# Patient Record
Sex: Male | Born: 1945 | Race: Black or African American | Hispanic: No | Marital: Single | Smoking: Never smoker
Health system: Southern US, Community
[De-identification: ages and names within clinical notes are randomized; demographics above are authoritative.]

---

## 2015-06-05 DIAGNOSIS — Z23 Encounter for immunization: Secondary | ICD-10-CM | POA: Diagnosis not present

## 2018-05-03 DIAGNOSIS — Z23 Encounter for immunization: Secondary | ICD-10-CM | POA: Diagnosis not present

## 2019-02-07 ENCOUNTER — Telehealth: Payer: Self-pay

## 2019-02-07 NOTE — Telephone Encounter (Signed)
Unable to reach pt as requested by insurance company for gap list, never seen pt before and we need to be removed off his insurance provider list,. 02/07/19

## 2020-12-20 ENCOUNTER — Emergency Department: Payer: Medicare Other

## 2020-12-20 ENCOUNTER — Inpatient Hospital Stay
Admission: EM | Admit: 2020-12-20 | Discharge: 2020-12-22 | DRG: 064 | Disposition: A | Discharge status: Home / Self Care | Payer: Medicare Other | Attending: Internal Medicine | Admitting: Internal Medicine

## 2020-12-20 ENCOUNTER — Inpatient Hospital Stay: Payer: Medicare Other

## 2020-12-20 ENCOUNTER — Inpatient Hospital Stay: Admit: 2020-12-20 | Payer: Medicare Other

## 2020-12-20 ENCOUNTER — Other Ambulatory Visit: Payer: Self-pay

## 2020-12-20 DIAGNOSIS — I358 Other nonrheumatic aortic valve disorders: Secondary | ICD-10-CM | POA: Diagnosis present

## 2020-12-20 DIAGNOSIS — U071 COVID-19: Secondary | ICD-10-CM | POA: Diagnosis present

## 2020-12-20 DIAGNOSIS — Z8249 Family history of ischemic heart disease and other diseases of the circulatory system: Secondary | ICD-10-CM

## 2020-12-20 DIAGNOSIS — E876 Hypokalemia: Secondary | ICD-10-CM | POA: Diagnosis present

## 2020-12-20 DIAGNOSIS — S8001XA Contusion of right knee, initial encounter: Secondary | ICD-10-CM | POA: Diagnosis present

## 2020-12-20 DIAGNOSIS — J9601 Acute respiratory failure with hypoxia: Secondary | ICD-10-CM | POA: Diagnosis present

## 2020-12-20 DIAGNOSIS — I700 Atherosclerosis of aorta: Secondary | ICD-10-CM | POA: Diagnosis present

## 2020-12-20 DIAGNOSIS — W1811XA Fall from or off toilet without subsequent striking against object, initial encounter: Secondary | ICD-10-CM | POA: Diagnosis present

## 2020-12-20 DIAGNOSIS — K760 Fatty (change of) liver, not elsewhere classified: Secondary | ICD-10-CM | POA: Diagnosis present

## 2020-12-20 DIAGNOSIS — I6389 Other cerebral infarction: Secondary | ICD-10-CM | POA: Diagnosis not present

## 2020-12-20 DIAGNOSIS — Z28310 Unvaccinated for covid-19: Secondary | ICD-10-CM | POA: Diagnosis not present

## 2020-12-20 DIAGNOSIS — I639 Cerebral infarction, unspecified: Secondary | ICD-10-CM | POA: Diagnosis present

## 2020-12-20 DIAGNOSIS — I6381 Other cerebral infarction due to occlusion or stenosis of small artery: Secondary | ICD-10-CM | POA: Diagnosis present

## 2020-12-20 DIAGNOSIS — R778 Other specified abnormalities of plasma proteins: Secondary | ICD-10-CM | POA: Diagnosis present

## 2020-12-20 DIAGNOSIS — N179 Acute kidney failure, unspecified: Secondary | ICD-10-CM | POA: Diagnosis present

## 2020-12-20 DIAGNOSIS — I214 Non-ST elevation (NSTEMI) myocardial infarction: Secondary | ICD-10-CM

## 2020-12-20 DIAGNOSIS — R7989 Other specified abnormal findings of blood chemistry: Secondary | ICD-10-CM | POA: Diagnosis present

## 2020-12-20 DIAGNOSIS — J1282 Pneumonia due to coronavirus disease 2019: Secondary | ICD-10-CM | POA: Diagnosis present

## 2020-12-20 DIAGNOSIS — R4781 Slurred speech: Secondary | ICD-10-CM | POA: Diagnosis present

## 2020-12-20 DIAGNOSIS — M6282 Rhabdomyolysis: Secondary | ICD-10-CM | POA: Diagnosis present

## 2020-12-20 DIAGNOSIS — I10 Essential (primary) hypertension: Secondary | ICD-10-CM | POA: Diagnosis present

## 2020-12-20 DIAGNOSIS — I6329 Cerebral infarction due to unspecified occlusion or stenosis of other precerebral arteries: Secondary | ICD-10-CM | POA: Diagnosis present

## 2020-12-20 DIAGNOSIS — E860 Dehydration: Secondary | ICD-10-CM | POA: Diagnosis present

## 2020-12-20 DIAGNOSIS — R599 Enlarged lymph nodes, unspecified: Secondary | ICD-10-CM | POA: Diagnosis present

## 2020-12-20 DIAGNOSIS — W19XXXA Unspecified fall, initial encounter: Secondary | ICD-10-CM | POA: Diagnosis present

## 2020-12-20 LAB — URINALYSIS, COMPLETE (UACMP) WITH MICROSCOPIC
Bilirubin Urine: NEGATIVE
Glucose, UA: NEGATIVE mg/dL
Hgb urine dipstick: NEGATIVE
Ketones, ur: NEGATIVE mg/dL
Leukocytes,Ua: NEGATIVE
Nitrite: NEGATIVE
Protein, ur: 100 mg/dL — AB
Specific Gravity, Urine: 1.025 (ref 1.005–1.030)
pH: 5 (ref 5.0–8.0)

## 2020-12-20 LAB — COMPREHENSIVE METABOLIC PANEL
ALT: 35 U/L (ref 0–44)
AST: 51 U/L — ABNORMAL HIGH (ref 15–41)
Albumin: 4.5 g/dL (ref 3.5–5.0)
Alkaline Phosphatase: 48 U/L (ref 38–126)
Anion gap: 12 (ref 5–15)
BUN: 25 mg/dL — ABNORMAL HIGH (ref 8–23)
CO2: 23 mmol/L (ref 22–32)
Calcium: 9 mg/dL (ref 8.9–10.3)
Chloride: 101 mmol/L (ref 98–111)
Creatinine, Ser: 1.81 mg/dL — ABNORMAL HIGH (ref 0.61–1.24)
GFR, Estimated: 39 mL/min — ABNORMAL LOW (ref >60)
Glucose, Bld: 111 mg/dL — ABNORMAL HIGH (ref 70–99)
Potassium: 3.2 mmol/L — ABNORMAL LOW (ref 3.5–5.1)
Sodium: 136 mmol/L (ref 135–145)
Total Bilirubin: 0.9 mg/dL (ref 0.3–1.2)
Total Protein: 7.8 g/dL (ref 6.5–8.1)

## 2020-12-20 LAB — CBC
HCT: 47.9 % (ref 39.0–52.0)
Hemoglobin: 16.7 g/dL (ref 13.0–17.0)
MCH: 25.9 pg — ABNORMAL LOW (ref 26.0–34.0)
MCHC: 34.9 g/dL (ref 30.0–36.0)
MCV: 74.4 fL — ABNORMAL LOW (ref 80.0–100.0)
Platelets: 185 10*3/uL (ref 150–400)
RBC: 6.44 MIL/uL — ABNORMAL HIGH (ref 4.22–5.81)
RDW: 14.1 % (ref 11.5–15.5)
WBC: 7.5 10*3/uL (ref 4.0–10.5)
nRBC: 0 % (ref 0.0–0.2)

## 2020-12-20 LAB — MAGNESIUM: Magnesium: 2.3 mg/dL (ref 1.7–2.4)

## 2020-12-20 LAB — C-REACTIVE PROTEIN: CRP: 14.3 mg/dL — ABNORMAL HIGH (ref <1.0)

## 2020-12-20 LAB — TROPONIN I (HIGH SENSITIVITY)
Troponin I (High Sensitivity): 56 ng/L — ABNORMAL HIGH (ref <18)
Troponin I (High Sensitivity): 59 ng/L — ABNORMAL HIGH (ref <18)
Troponin I (High Sensitivity): 63 ng/L — ABNORMAL HIGH (ref <18)
Troponin I (High Sensitivity): 39 ng/L — ABNORMAL HIGH (ref <18)

## 2020-12-20 LAB — PROCALCITONIN: Procalcitonin: 0.29 ng/mL

## 2020-12-20 LAB — PROTIME-INR
INR: 1.1 (ref 0.8–1.2)
Prothrombin Time: 14.6 seconds (ref 11.4–15.2)

## 2020-12-20 LAB — FERRITIN: Ferritin: 220 ng/mL (ref 24–336)

## 2020-12-20 LAB — RESP PANEL BY RT-PCR (FLU A&B, COVID) ARPGX2
Influenza A by PCR: NEGATIVE
Influenza B by PCR: NEGATIVE
SARS Coronavirus 2 by RT PCR: POSITIVE — AB

## 2020-12-20 LAB — TSH: TSH: 1.449 u[IU]/mL (ref 0.350–4.500)

## 2020-12-20 LAB — BRAIN NATRIURETIC PEPTIDE: B Natriuretic Peptide: 24.9 pg/mL (ref 0.0–100.0)

## 2020-12-20 LAB — CK: Total CK: 819 U/L — ABNORMAL HIGH (ref 49–397)

## 2020-12-20 LAB — D-DIMER, QUANTITATIVE: D-Dimer, Quant: 2.78 ug/mL-FEU — ABNORMAL HIGH (ref 0.00–0.50)

## 2020-12-20 LAB — APTT: aPTT: 32 seconds (ref 24–36)

## 2020-12-20 IMAGING — CR DG CHEST 2V
2 series · 2 of 2 positions shown · non-contrast
Comparison: None

CLINICAL DATA: Cough, fever, leg weakness

EXAM:
CHEST - 2 VIEW

[chest pa]
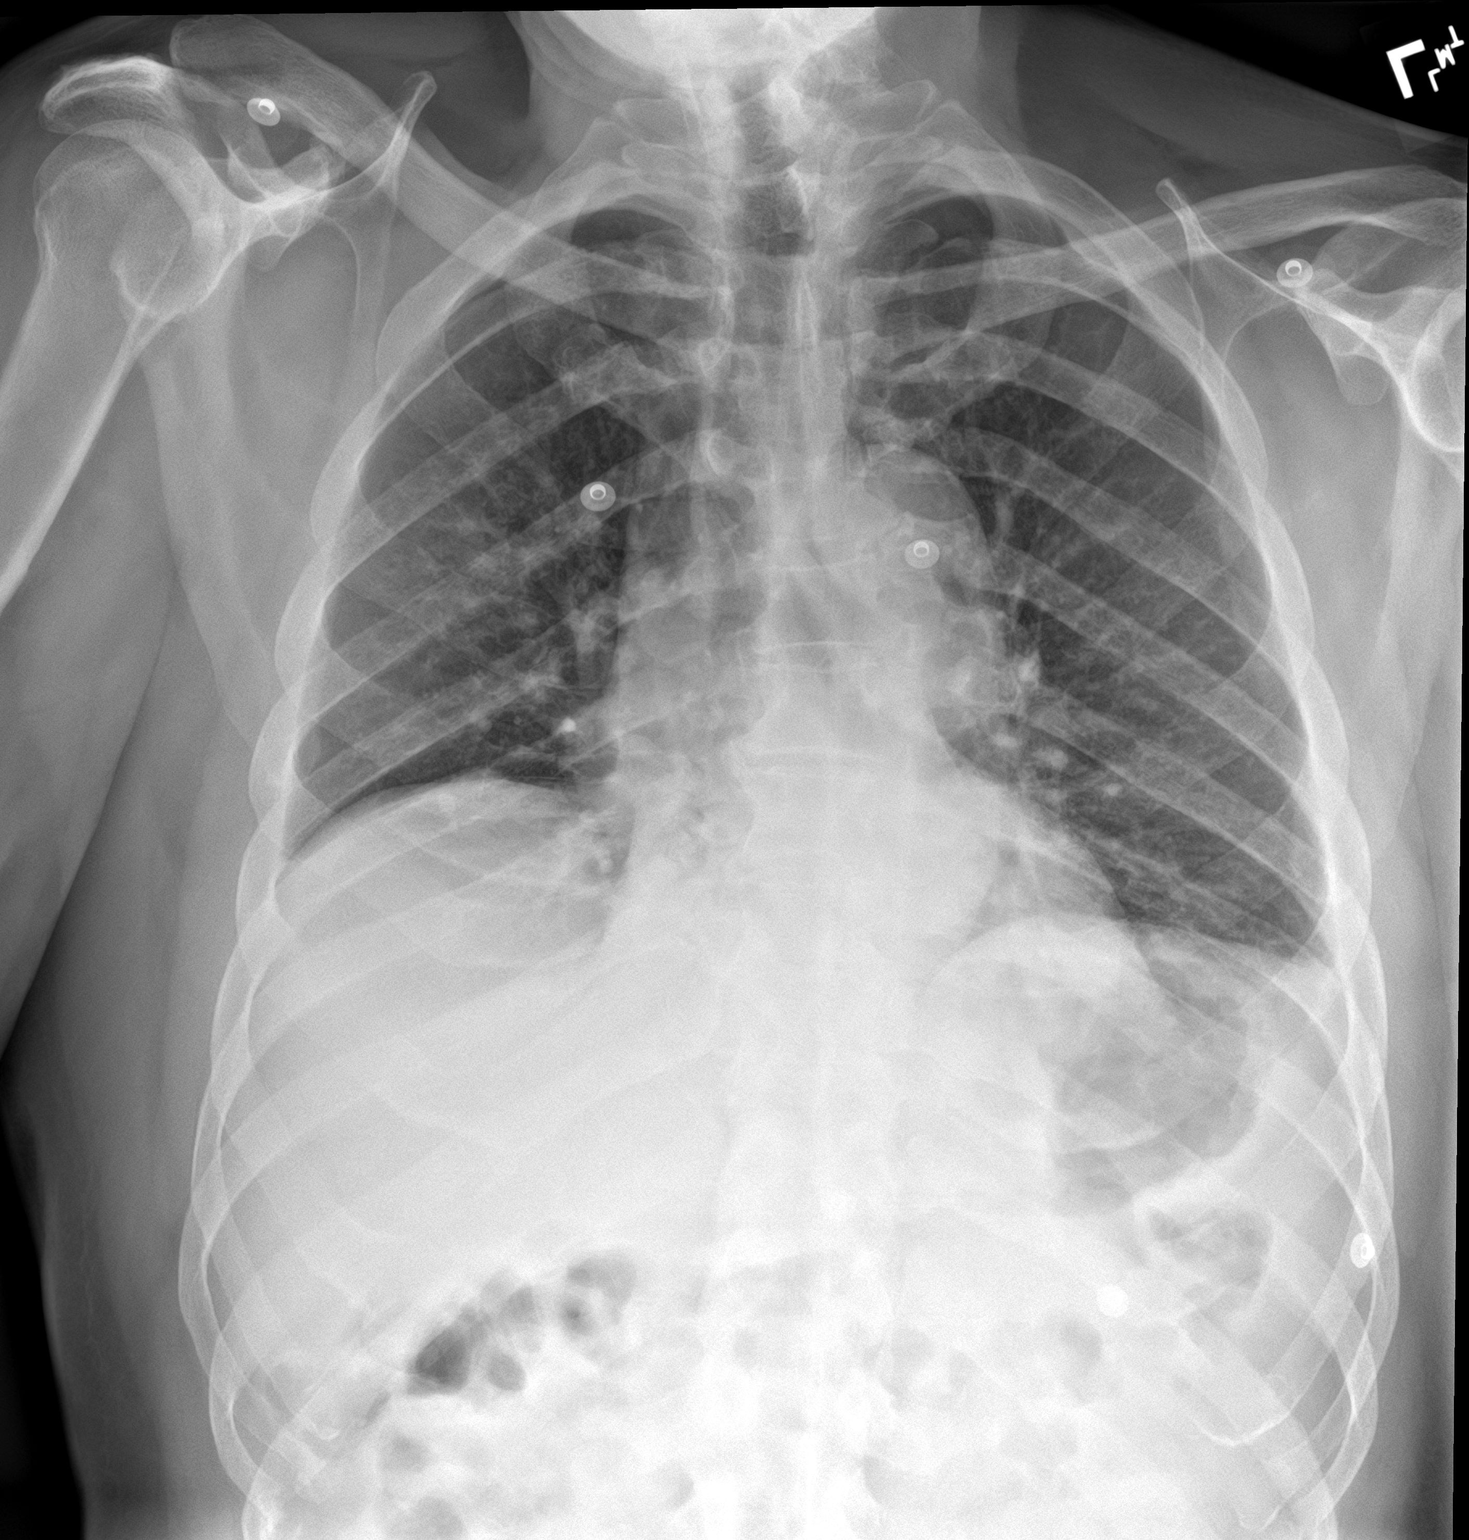

[chest lat]
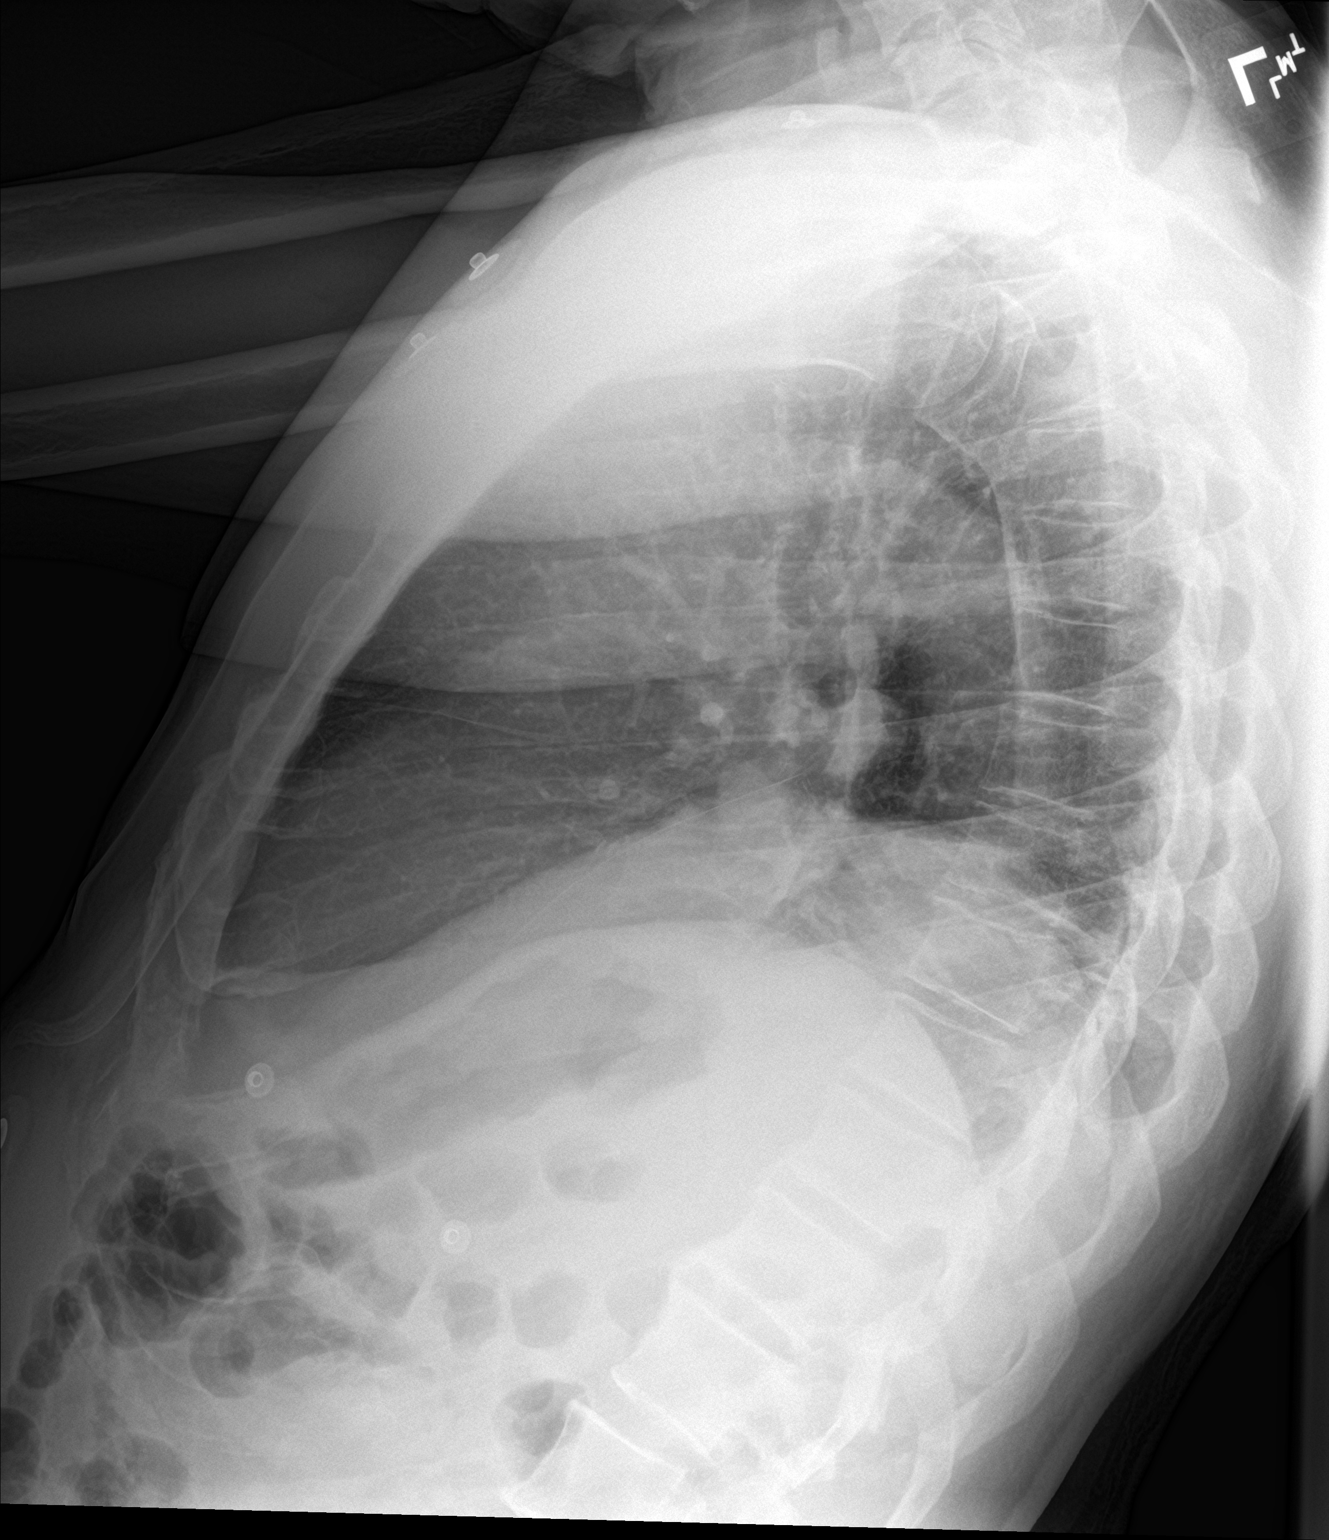

[2 of 2 positions shown; findings below may reference images not displayed]

FINDINGS: Normal heart size, mediastinal contours, and pulmonary vascularity.

Elevation versus eventration of RIGHT diaphragm.

Question hazy infiltrate in RIGHT upper lobe.

Mild RIGHT basilar atelectasis.

LEFT lung clear.

No pleural effusion or pneumothorax.
IMPRESSION: RIGHT basilar atelectasis with questionable hazy infiltrate in RIGHT
upper lobe.
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
## 2020-12-20 IMAGING — CR DG KNEE COMPLETE 4+V*R*
1 series · 4 of 4 positions shown · non-contrast
Comparison: None.

CLINICAL DATA: Right knee pain after falling a few days previously

EXAM:
RIGHT KNEE - COMPLETE 4+ VIEW

[Series 1: dg knee complete 4 views right · 0.14mm/px · 4 of 4 slices shown]
[im 1/4]
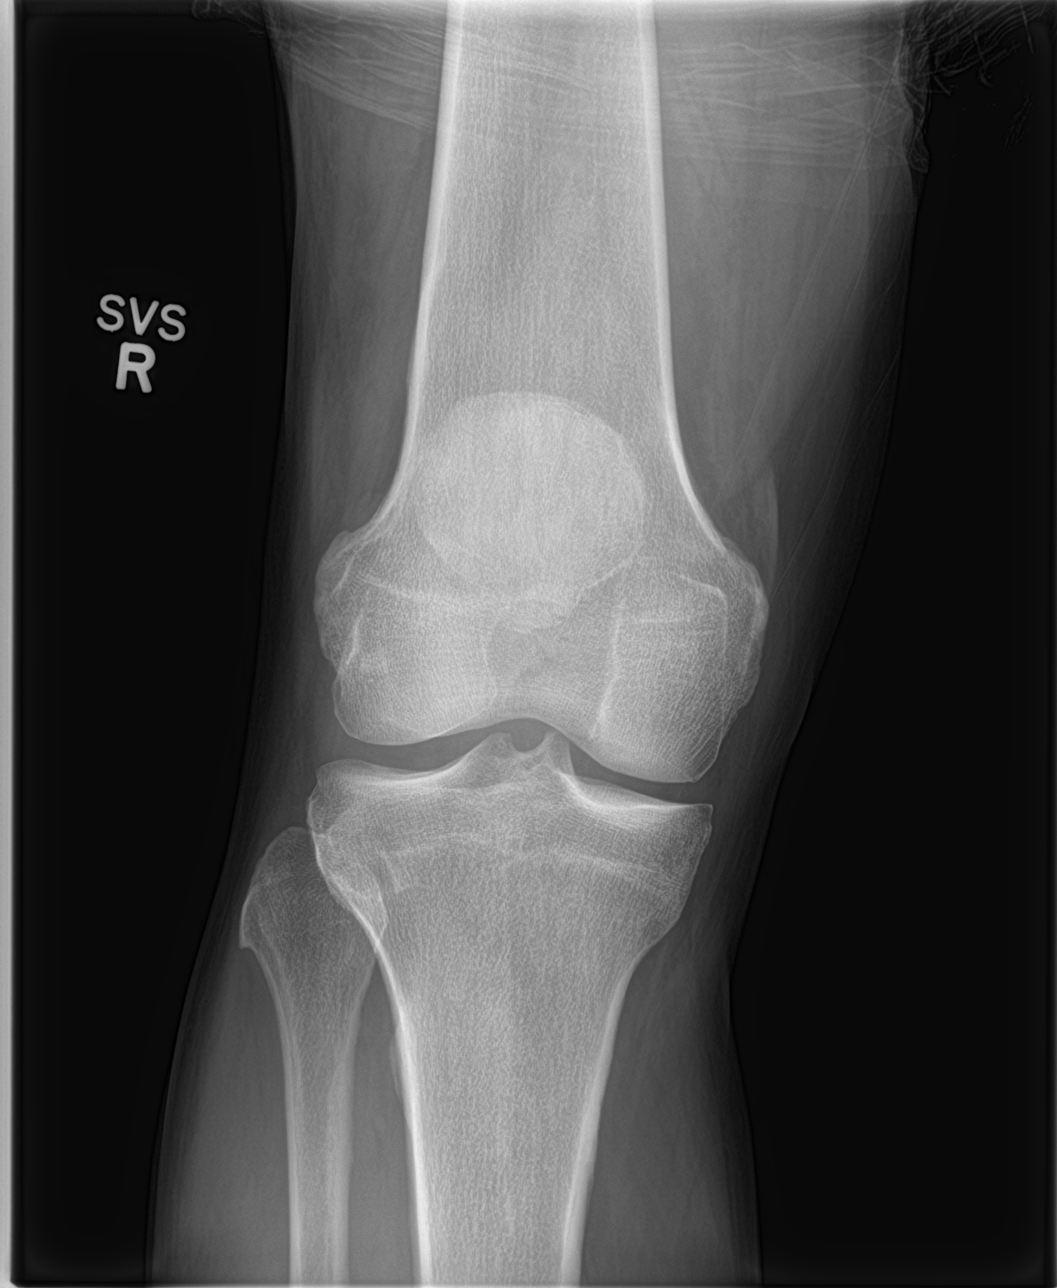
[im 2/4]
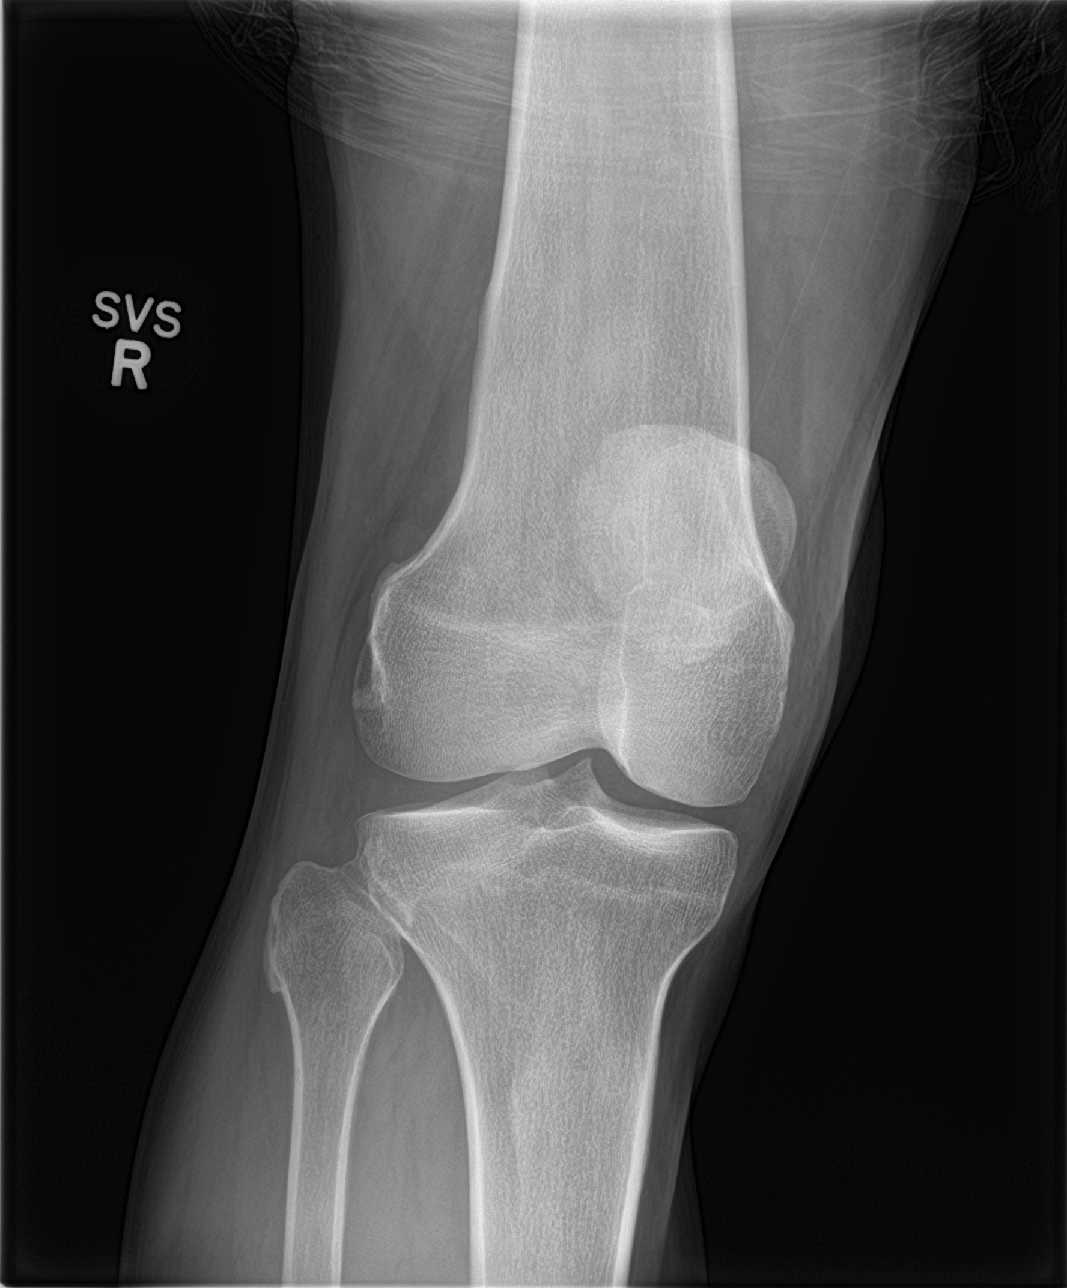
[im 3/4]
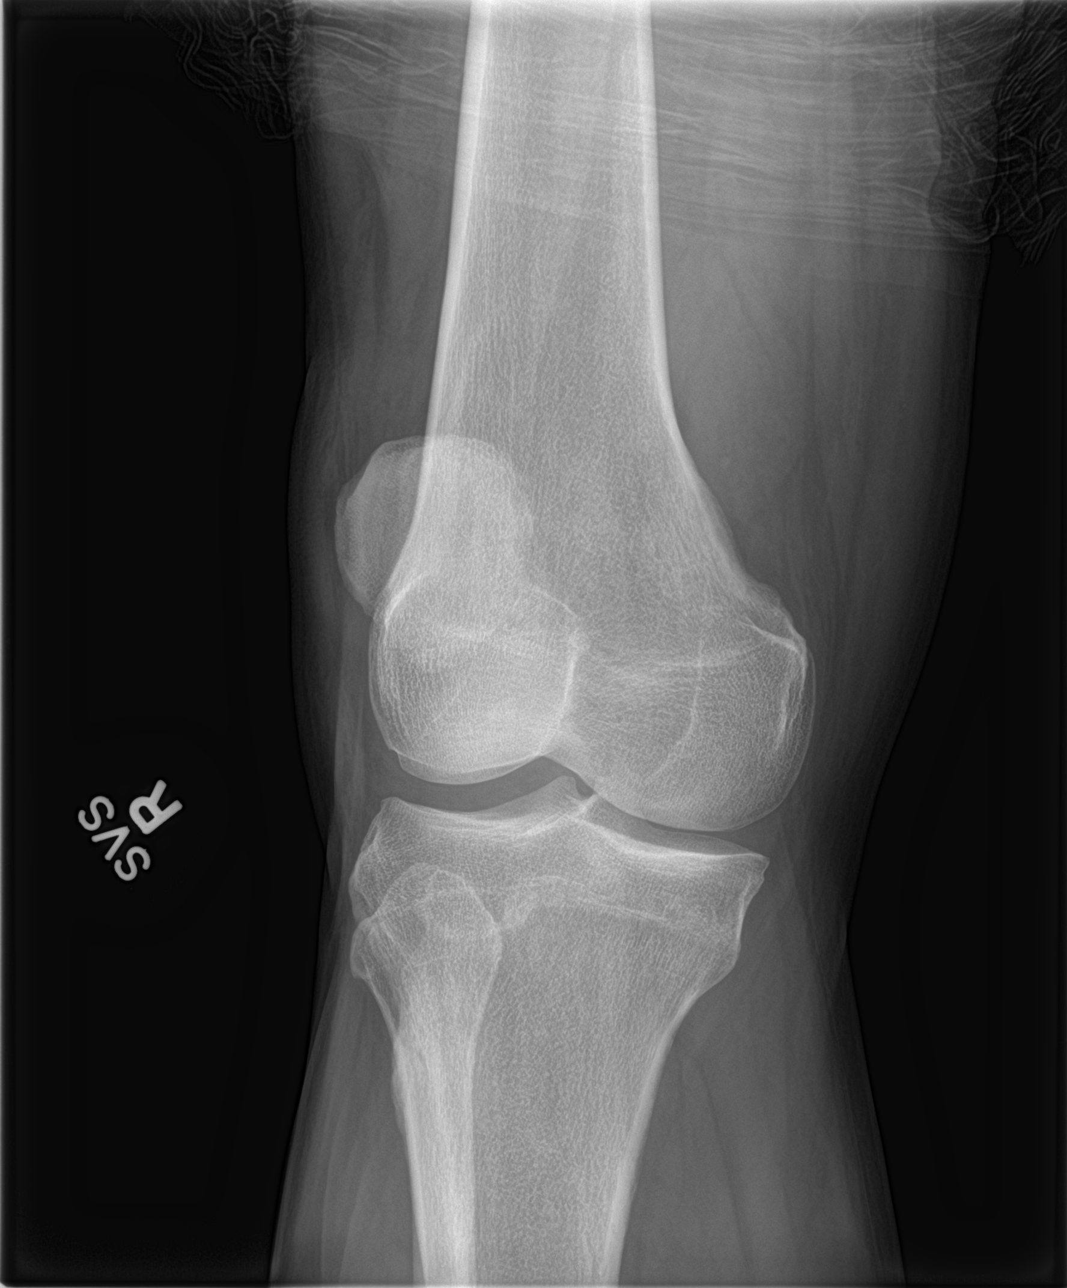
[im 4/4]
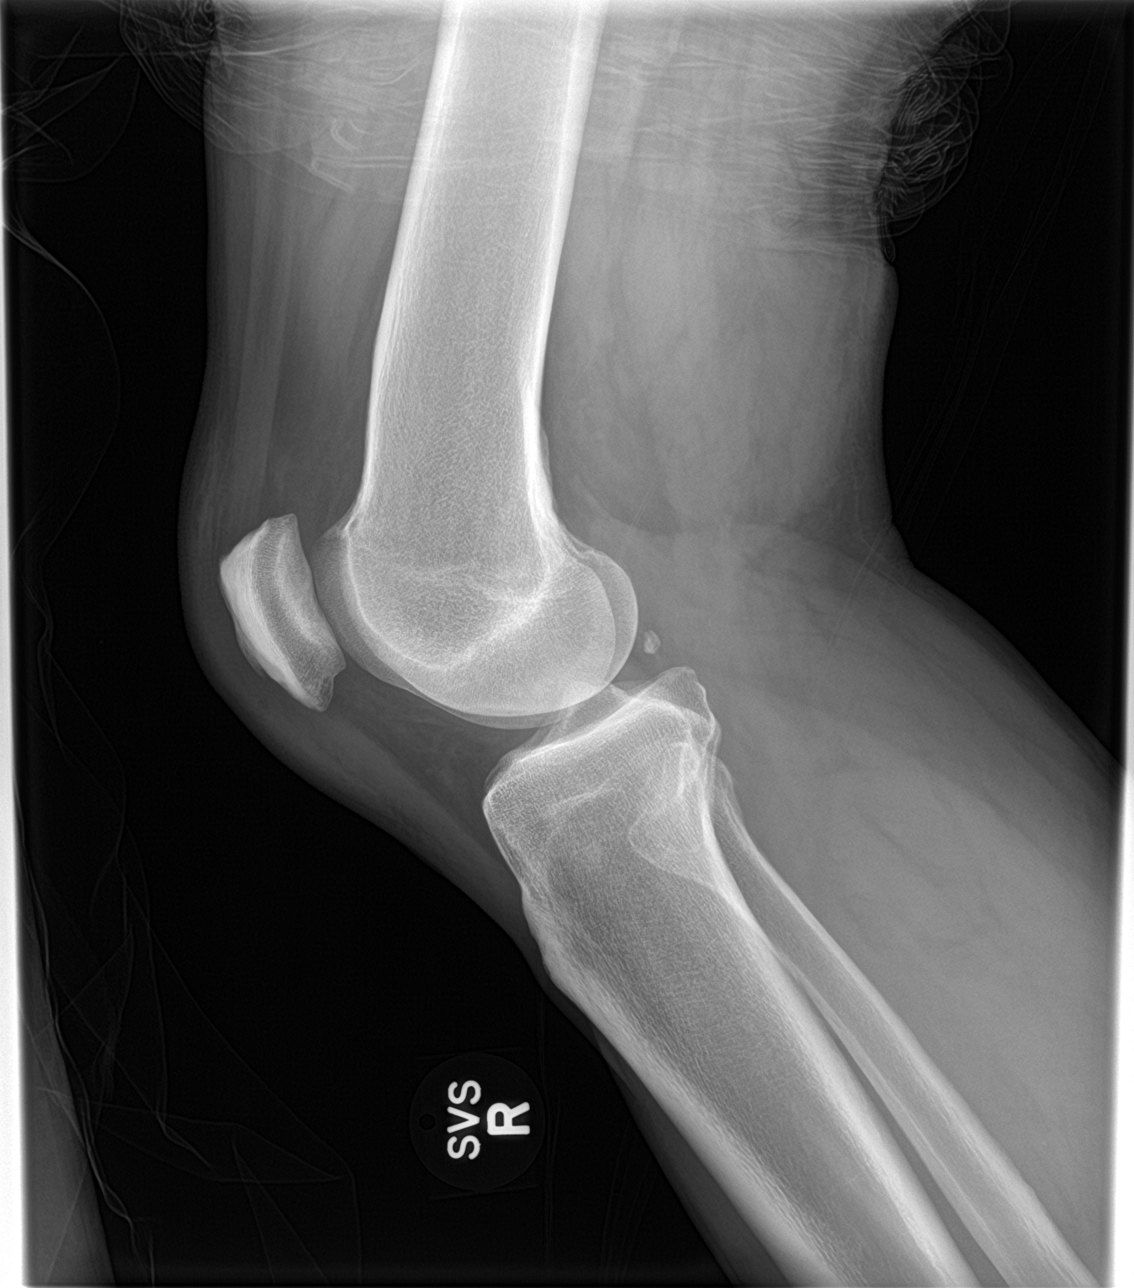

[4 of 4 positions shown; findings below may reference images not displayed]

FINDINGS: No acute fracture, malalignment or joint effusion. No lytic or
blastic osseous lesion. Focal soft tissue swelling is present in the
prepatellar space. The remaining soft tissues are unremarkable.
IMPRESSION: Prepatellar soft tissue swelling may reflect contusion or bursitis.

No evidence of fracture, malalignment or knee joint effusion.

## 2020-12-20 IMAGING — CT CT ANGIO CHEST
2 of 6 series · 17 of 46 positions shown · IV contrast (APPLIED)
Comparison: Chest radiograph [DATE]

CLINICAL DATA: Shortness of breath with elevated D-dimer study

EXAM:
CT ANGIOGRAPHY CHEST WITH CONTRAST
TECHNIQUE: Multidetector CT imaging of the chest was performed using the
standard protocol during bolus administration of intravenous
contrast. Multiplanar CT image reconstructions and MIPs were
obtained to evaluate the vascular anatomy.
CONTRAST:  60mL OMNIPAQUE IOHEXOL 350 MG/ML SOLN

[Series 5: thins · axial · 0.70mm/px · z∈[-612,-367]mm · 14 of 334 slices shown]
[im 14/334  lung]
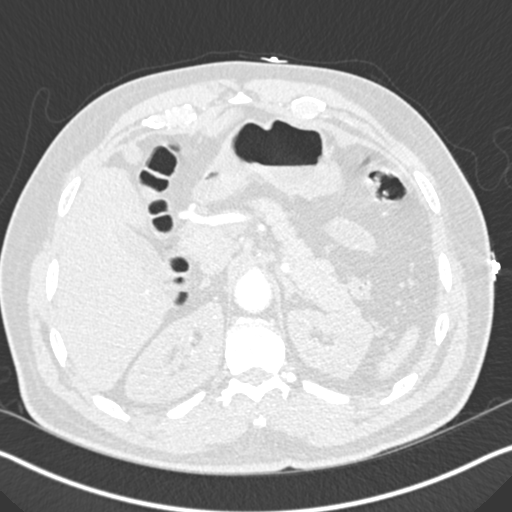
[im 42/334  soft-tissue]
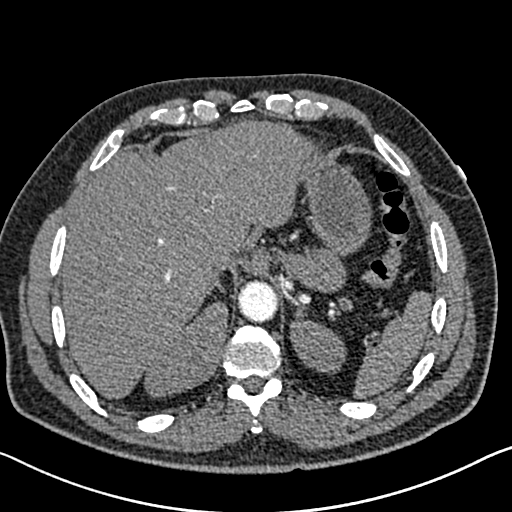
[im 70/334  lung]
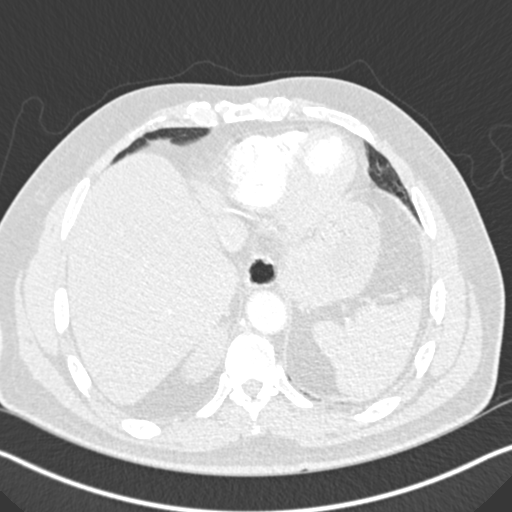
[im 84/334  soft-tissue]
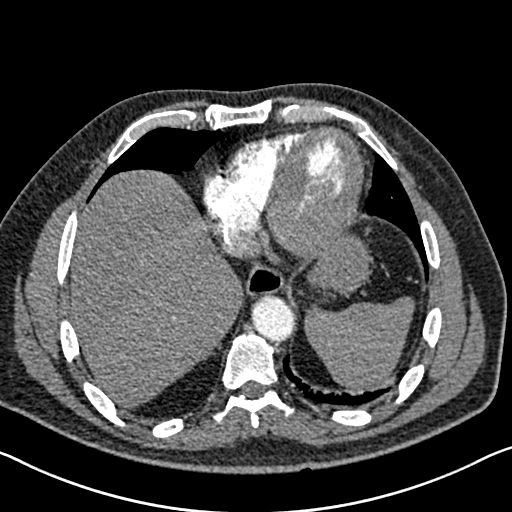
[im 112/334  lung]
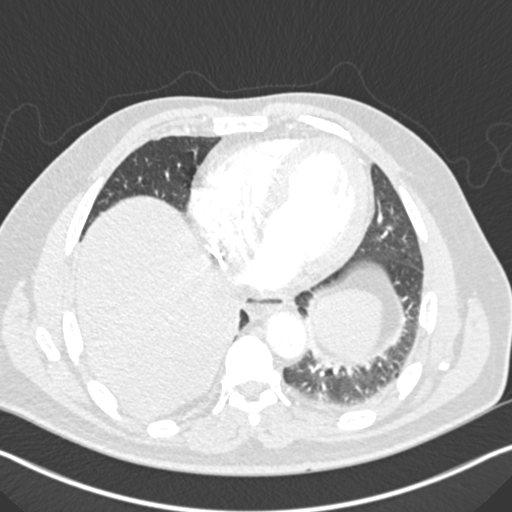
[im 139/334  soft-tissue]
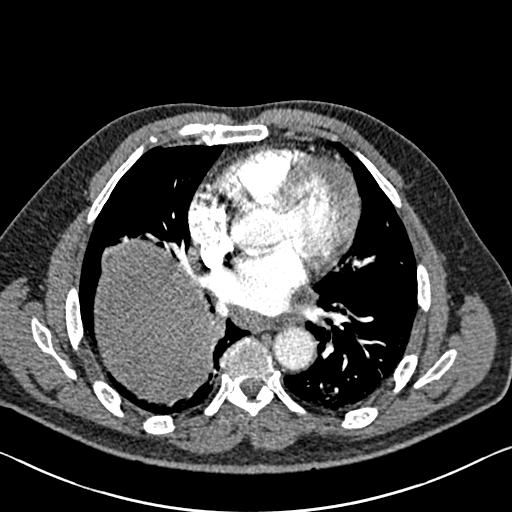
[im 153/334  lung]
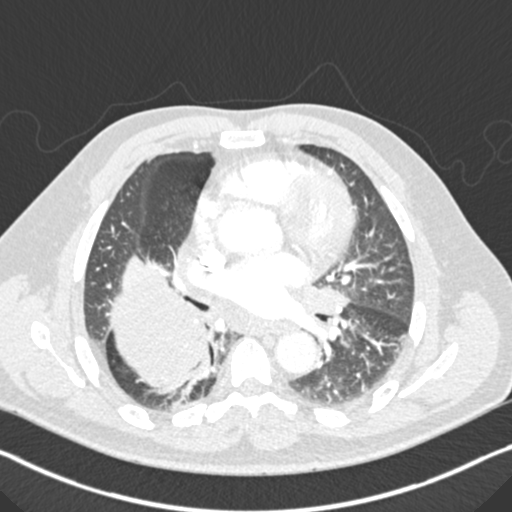
[im 181/334  soft-tissue]
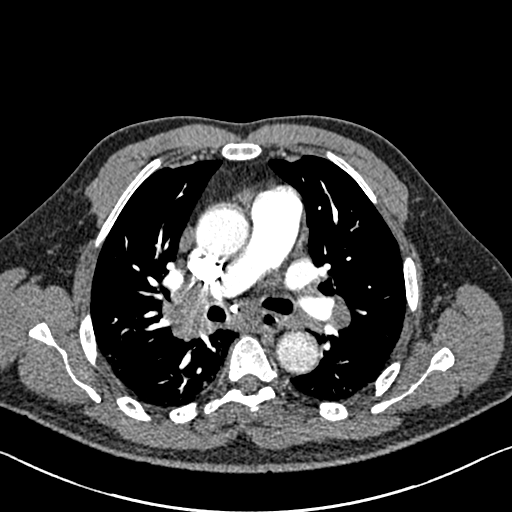
[im 195/334  lung]
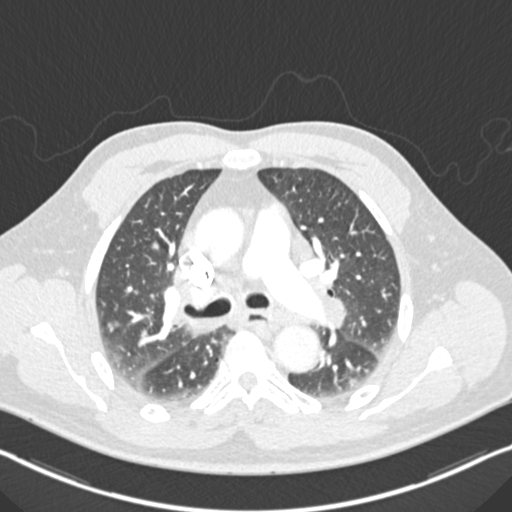
[im 223/334  soft-tissue]
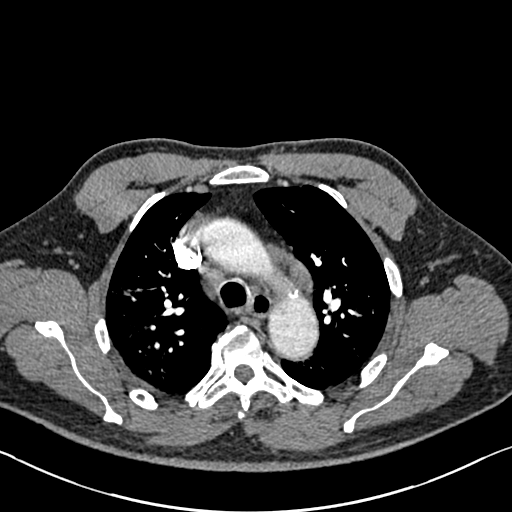
[im 250/334  lung]
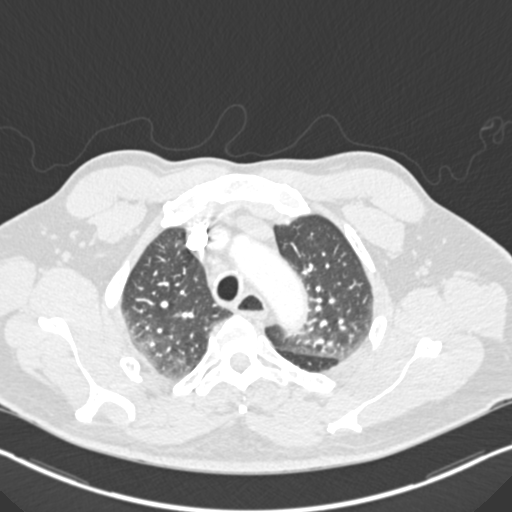
[im 264/334  soft-tissue]
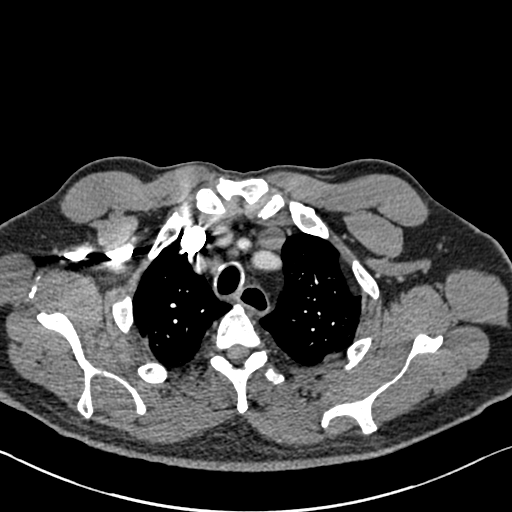
[im 292/334  lung]
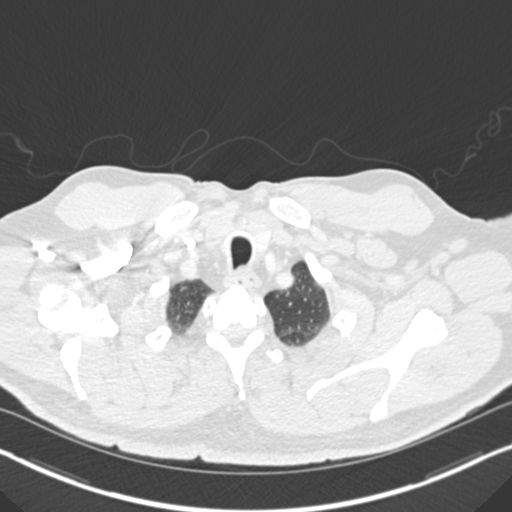
[im 320/334  soft-tissue]
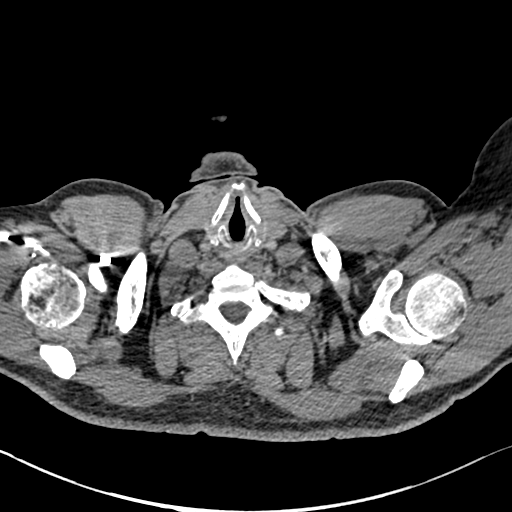

[Series 7: coronal mpr · coronal · 0.54mm/px · 3 of 97 slices shown]
[im 25/97  soft-tissue]
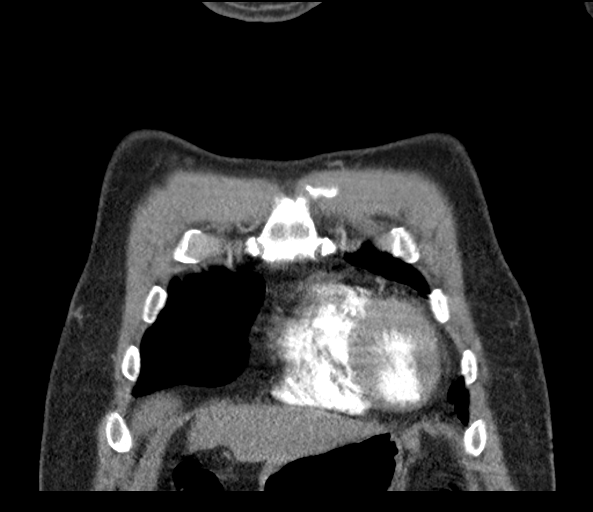
[im 49/97  soft-tissue]
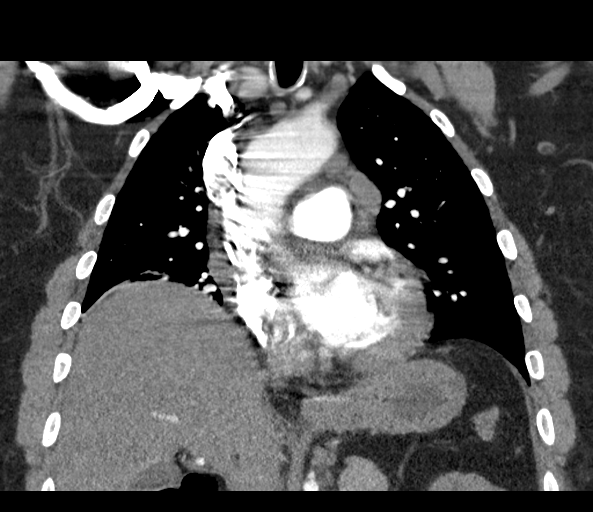
[im 73/97  soft-tissue]
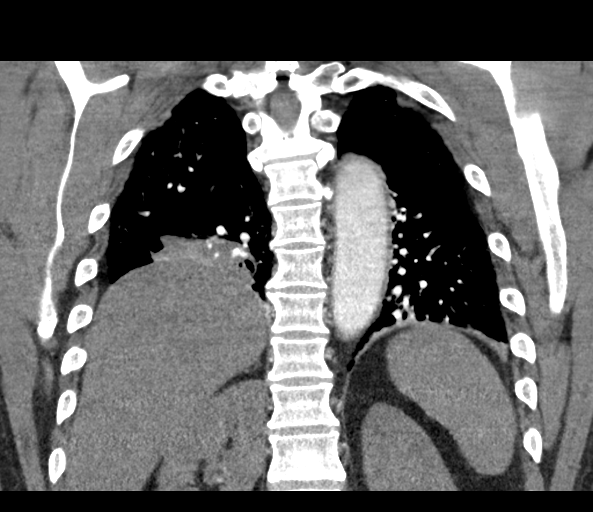

[17 of 46 positions shown; findings below may reference images not displayed]

FINDINGS: Cardiovascular: There is no appreciable pulmonary embolus. There is
no appreciable thoracic aortic aneurysm or dissection. Visualized
great vessels appear unremarkable. There are occasional foci of
aortic atherosclerosis. Occasional foci coronary artery
calcification noted. There is no pericardial effusion or pericardial
thickening.

Mediastinum/Nodes: Thyroid appears normal. There is multifocal
adenopathy. There is an aortopulmonary window lymph node measuring
3.0 x 2.1 cm. An adjacent lymph node in this area measures 1.1 x
cm. There is a lymph node to the left of carina measuring 1.5 x
cm. There is a lymph node anteriorly along the rightward aspect of
the distal trachea measuring 1.8 x 1.7 cm. There are several
subcarinal lymph nodes, largest measuring 1.9 x 1.8 cm. There is
adenopathy in the left hilar region, largest lymph node measuring
1.8 x 1.5 cm in this area. There are multiple right hilar lymph
nodes, largest measuring 1.8 x 1.6 cm. No esophageal lesions are
appreciable.

Lungs/Pleura: There is elevation of the right hemidiaphragm. There
is patchy airspace opacity in the right base. There is atelectatic
change in the left base. Areas of patchy airspace opacity are noted
in the anterior and posterior segments of the right upper lobe with
several tree on HOLINESS type opacities as well as areas of slightly more
focal infiltrate in the right upper lobe. Trachea and major
bronchial structures appear patent. No pneumothorax. No pleural
effusions.

Upper Abdomen: There is hepatic steatosis. Visualized upper
abdominal structures otherwise appear unremarkable.

Musculoskeletal: No blastic or lytic bone lesions. No appreciable
chest wall lesions.

Review of the MIP images confirms the above findings.
IMPRESSION: 1. No evident pulmonary embolus. No thoracic aortic aneurysm or
dissection. Occasional foci of aortic atherosclerosis and coronary
artery calcification.

2. Multifocal adenopathy of uncertain etiology. Neoplastic cause for
adenopathy must be of concern.

3. Areas of airspace opacity in the right upper lobe and to a lesser
degree in the right lower lobe. Atelectatic change in the lung bases
also present. There is elevation of the right hemidiaphragm.

4.  Hepatic steatosis.

Aortic Atherosclerosis ([8D]-[8D]).
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
## 2020-12-20 IMAGING — US US EXTREM LOW VENOUS
1 series · 14 of 24 positions shown · non-contrast
Comparison: None.

CLINICAL DATA: Positive D-dimer

EXAM:
BILATERAL LOWER EXTREMITY VENOUS DOPPLER ULTRASOUND
TECHNIQUE: Gray-scale sonography with compression, as well as color and duplex
ultrasound, were performed to evaluate the deep venous system(s)
from the level of the common femoral vein through the popliteal and
proximal calf veins.

[Series 1: us venous img lower bilat (dvt) · portal-venous · 14 of 60 slices shown]
[im 1/60]
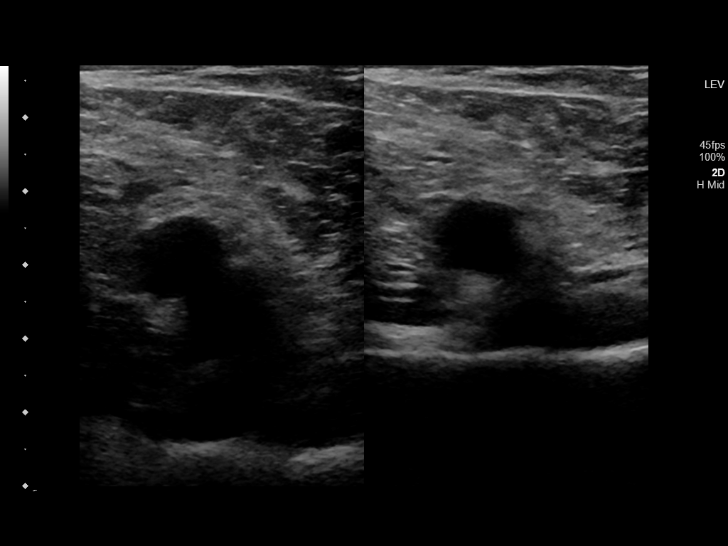
[im 6/60]
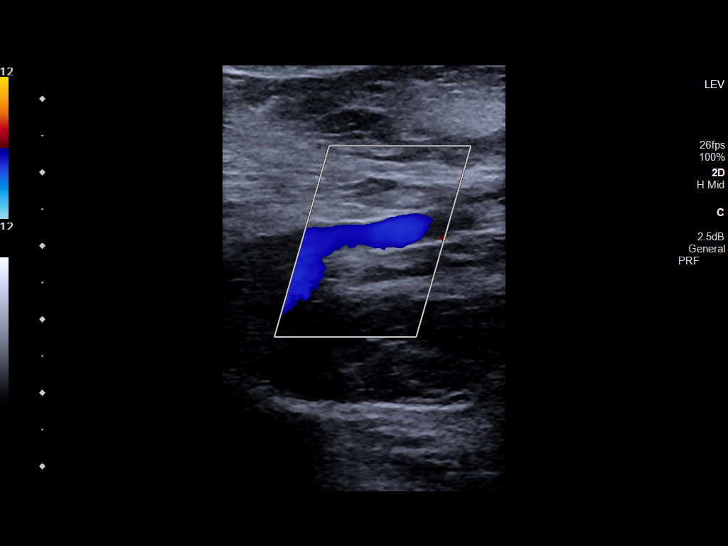
[im 11/60]
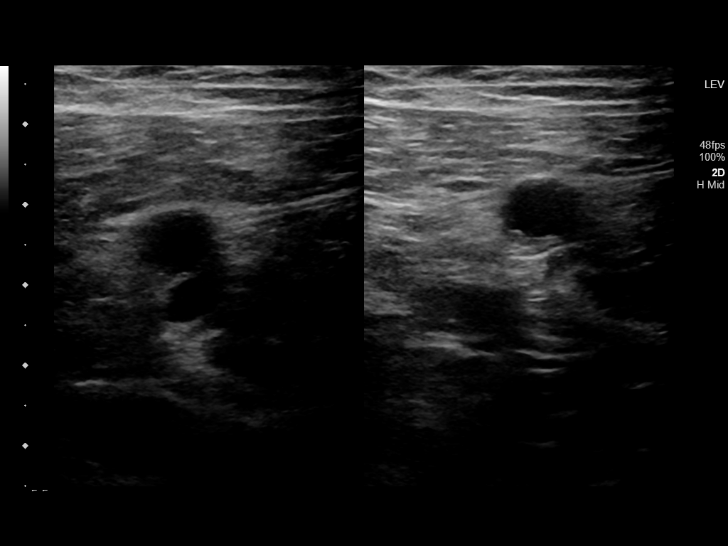
[im 16/60]
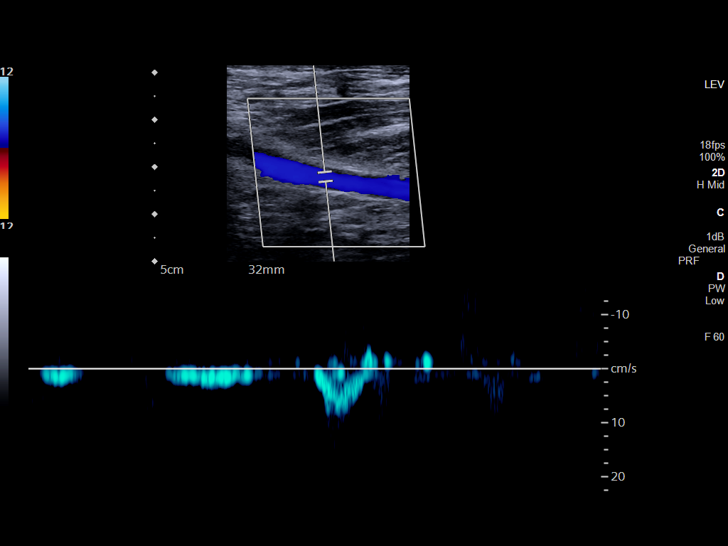
[im 18/60]
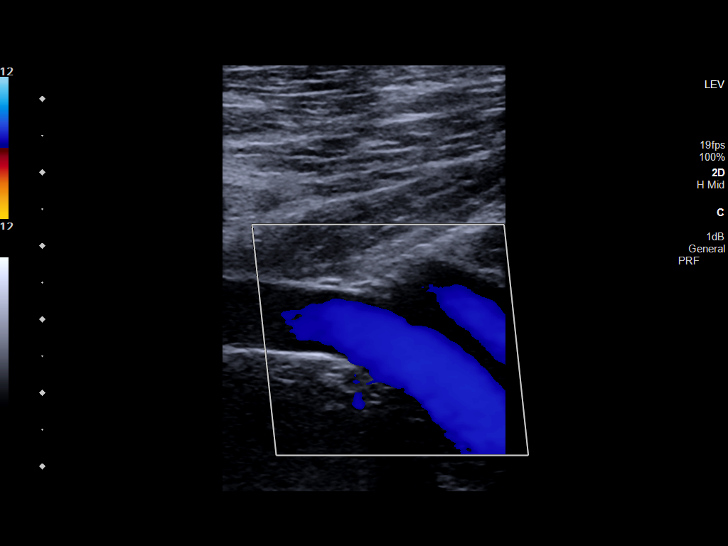
[im 24/60]
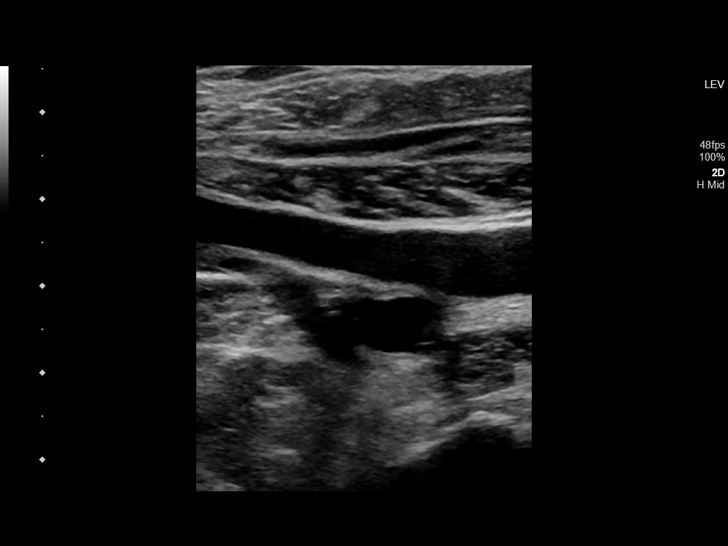
[im 29/60]
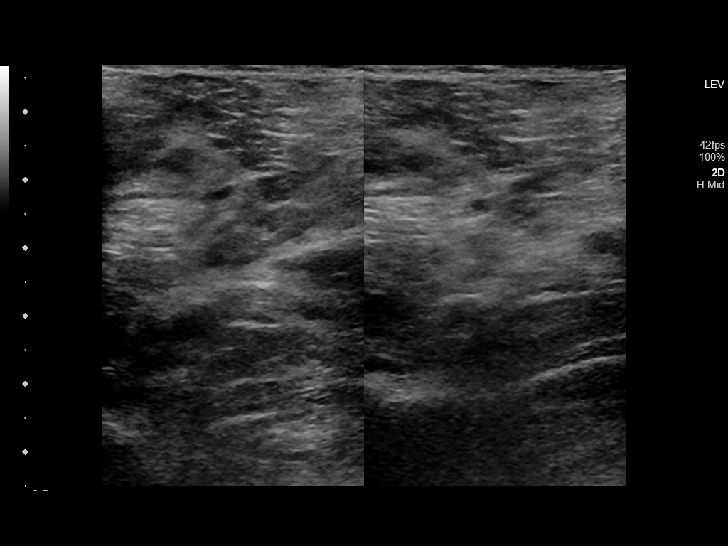
[im 31/60]
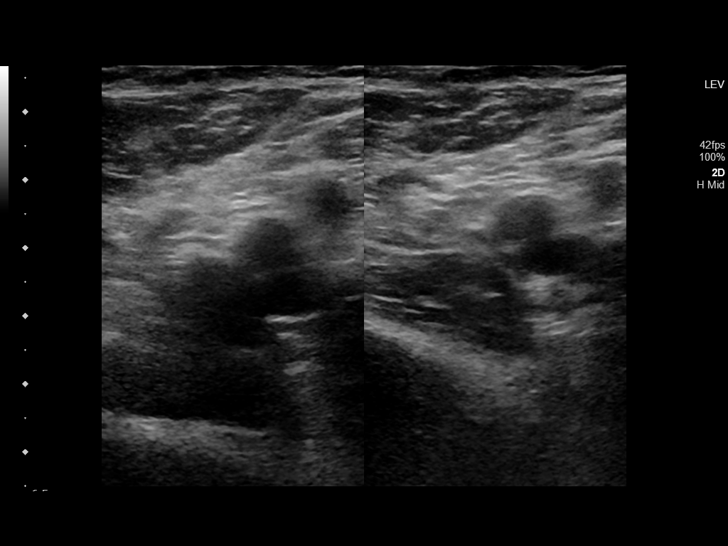
[im 36/60]
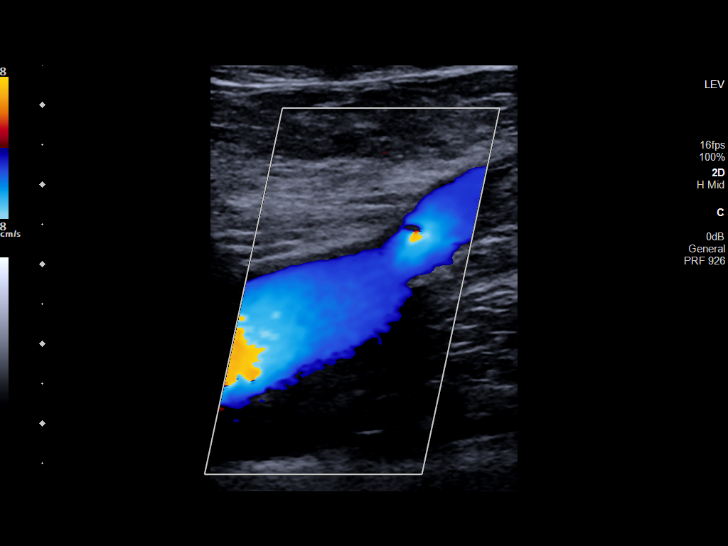
[im 42/60]
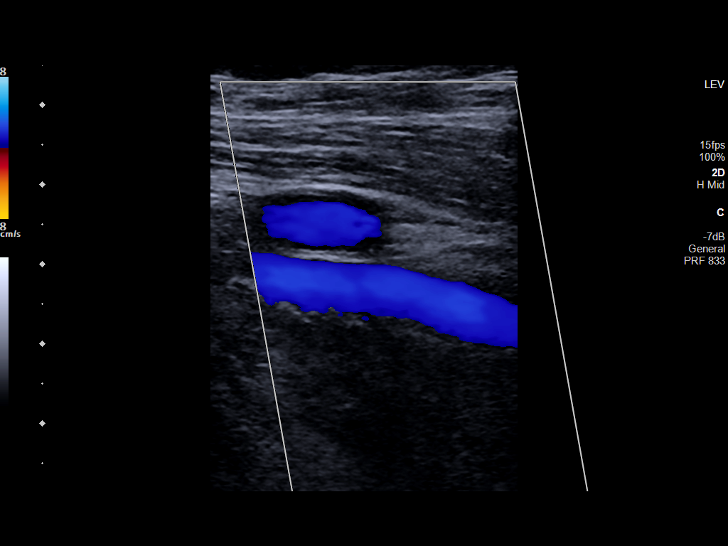
[im 47/60]
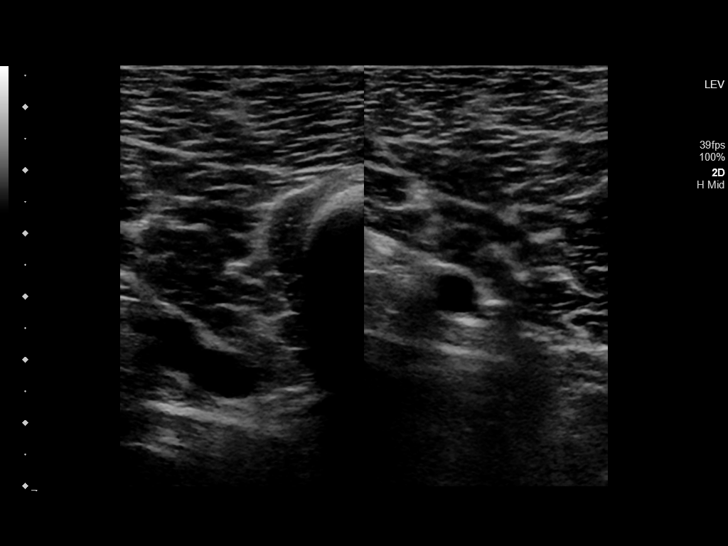
[im 49/60]
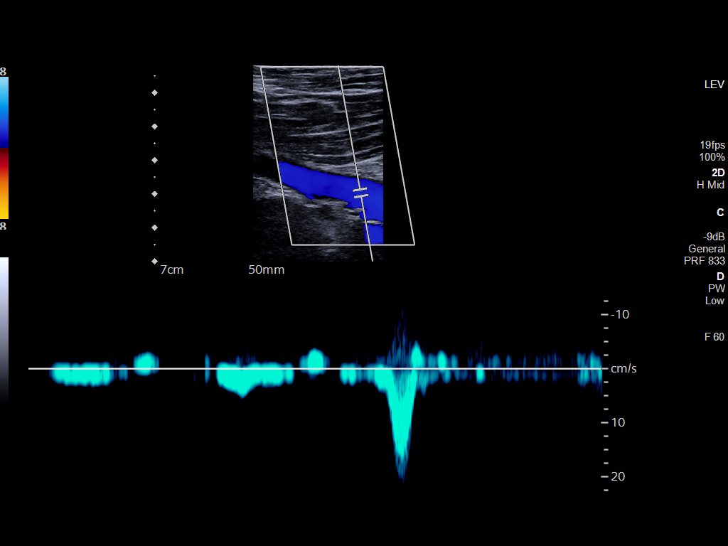
[im 54/60]
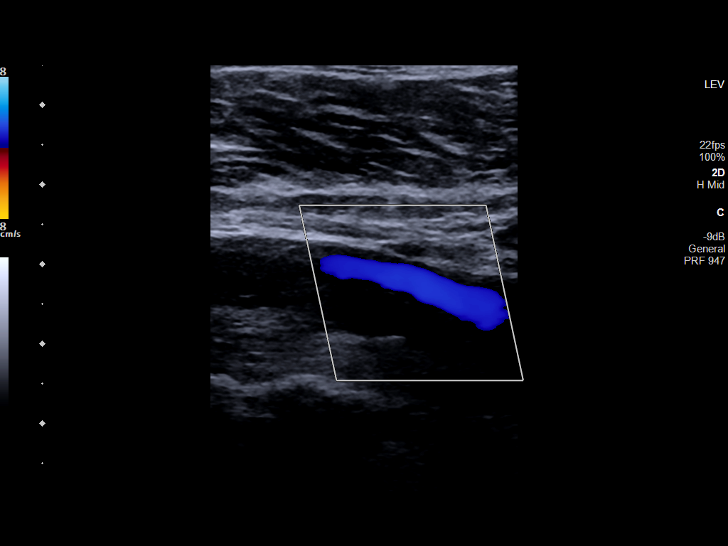
[im 60/60]
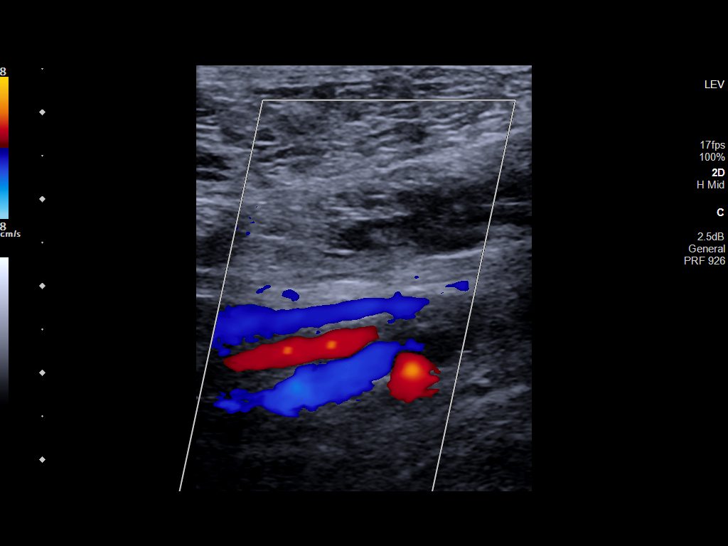

[14 of 24 positions shown; findings below may reference images not displayed]

FINDINGS: VENOUS

Normal compressibility of the common femoral, superficial femoral,
and popliteal veins, as well as the visualized calf veins.
Visualized portions of profunda femoral vein and great saphenous
vein unremarkable. No filling defects to suggest DVT on grayscale or
color Doppler imaging. Doppler waveforms show normal direction of
venous flow, normal respiratory plasticity and response to
augmentation.

OTHER

None.

Limitations: none
IMPRESSION: Negative.
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
## 2020-12-20 MED ORDER — ASPIRIN EC 81 MG PO TBEC
81.0000 mg | DELAYED_RELEASE_TABLET | Freq: Every day | ORAL | Status: DC
  Administered 2020-12-21 – 2020-12-22 (×2): 81 mg via ORAL
  Filled 2020-12-20 (×2): qty 1

## 2020-12-20 MED ORDER — IPRATROPIUM BROMIDE HFA 17 MCG/ACT IN AERS
2.0000 | INHALATION_SPRAY | Freq: Four times a day (QID) | RESPIRATORY_TRACT | Status: DC
  Administered 2020-12-20 – 2020-12-22 (×7): 2 via RESPIRATORY_TRACT
  Filled 2020-12-20: qty 12.9

## 2020-12-20 MED ORDER — IOHEXOL 350 MG/ML SOLN
60.0000 mL | Freq: Once | INTRAVENOUS | Status: AC | PRN
  Administered 2020-12-20: 60 mL via INTRAVENOUS

## 2020-12-20 MED ORDER — POTASSIUM CHLORIDE CRYS ER 20 MEQ PO TBCR
80.0000 meq | EXTENDED_RELEASE_TABLET | Freq: Once | ORAL | Status: DC
  Filled 2020-12-20: qty 4

## 2020-12-20 MED ORDER — ALBUTEROL SULFATE HFA 108 (90 BASE) MCG/ACT IN AERS
2.0000 | INHALATION_SPRAY | RESPIRATORY_TRACT | Status: DC | PRN
  Filled 2020-12-20: qty 6.7

## 2020-12-20 MED ORDER — HEPARIN BOLUS VIA INFUSION
4000.0000 [IU] | Freq: Once | INTRAVENOUS | Status: DC
  Filled 2020-12-20: qty 4000

## 2020-12-20 MED ORDER — ONDANSETRON HCL 4 MG/2ML IJ SOLN: 4.0000 mg | Freq: Three times a day (TID) | INTRAMUSCULAR | Status: DC | PRN

## 2020-12-20 MED ORDER — LACTATED RINGERS IV BOLUS
1000.0000 mL | Freq: Once | INTRAVENOUS | Status: AC
  Administered 2020-12-20: 1000 mL via INTRAVENOUS

## 2020-12-20 MED ORDER — SENNOSIDES-DOCUSATE SODIUM 8.6-50 MG PO TABS: 1.0000 | ORAL_TABLET | Freq: Every evening | ORAL | Status: DC | PRN

## 2020-12-20 MED ORDER — ASPIRIN 81 MG PO CHEW
324.0000 mg | CHEWABLE_TABLET | Freq: Once | ORAL | Status: AC
  Administered 2020-12-20: 324 mg via ORAL
  Filled 2020-12-20: qty 4

## 2020-12-20 MED ORDER — LABETALOL HCL 5 MG/ML IV SOLN
10.0000 mg | Freq: Once | INTRAVENOUS | Status: AC
  Administered 2020-12-20: 10 mg via INTRAVENOUS
  Filled 2020-12-20: qty 4

## 2020-12-20 MED ORDER — ACETAMINOPHEN 325 MG PO TABS: 650.0000 mg | ORAL_TABLET | ORAL | Status: DC | PRN

## 2020-12-20 MED ORDER — STROKE: EARLY STAGES OF RECOVERY BOOK: Freq: Once | Status: AC

## 2020-12-20 MED ORDER — ADULT MULTIVITAMIN W/MINERALS CH
1.0000 | ORAL_TABLET | Freq: Every day | ORAL | Status: DC
  Administered 2020-12-21 – 2020-12-22 (×2): 1 via ORAL
  Filled 2020-12-20 (×2): qty 1

## 2020-12-20 MED ORDER — ATORVASTATIN CALCIUM 20 MG PO TABS
40.0000 mg | ORAL_TABLET | Freq: Every day | ORAL | Status: DC
  Administered 2020-12-20 – 2020-12-21 (×2): 40 mg via ORAL
  Filled 2020-12-20 (×2): qty 2

## 2020-12-20 MED ORDER — HEPARIN (PORCINE) 25000 UT/250ML-% IV SOLN: 1100.0000 [IU]/h | INTRAVENOUS | Status: DC

## 2020-12-20 MED ORDER — ENOXAPARIN SODIUM 40 MG/0.4ML IJ SOSY
40.0000 mg | PREFILLED_SYRINGE | INTRAMUSCULAR | Status: DC
  Administered 2020-12-20 – 2020-12-21 (×2): 40 mg via SUBCUTANEOUS
  Filled 2020-12-20 (×2): qty 0.4

## 2020-12-20 MED ORDER — METHYLPREDNISOLONE SODIUM SUCC 40 MG IJ SOLR
40.0000 mg | Freq: Two times a day (BID) | INTRAMUSCULAR | Status: DC
  Administered 2020-12-20 – 2020-12-22 (×4): 40 mg via INTRAVENOUS
  Filled 2020-12-20 (×4): qty 1

## 2020-12-20 MED ORDER — POTASSIUM CHLORIDE CRYS ER 20 MEQ PO TBCR
40.0000 meq | EXTENDED_RELEASE_TABLET | ORAL | Status: AC
  Administered 2020-12-20 (×2): 40 meq via ORAL
  Filled 2020-12-20: qty 2

## 2020-12-20 MED ORDER — SODIUM CHLORIDE 0.9 % IV SOLN
100.0000 mg | Freq: Every day | INTRAVENOUS | Status: DC
  Administered 2020-12-21 – 2020-12-22 (×2): 100 mg via INTRAVENOUS
  Filled 2020-12-20: qty 100
  Filled 2020-12-20 (×2): qty 20

## 2020-12-20 MED ORDER — ACETAMINOPHEN 650 MG RE SUPP: 650.0000 mg | RECTAL | Status: DC | PRN

## 2020-12-20 MED ORDER — AMLODIPINE BESYLATE 5 MG PO TABS
5.0000 mg | ORAL_TABLET | Freq: Every day | ORAL | Status: DC
  Administered 2020-12-20 – 2020-12-21 (×2): 5 mg via ORAL
  Filled 2020-12-20 (×2): qty 1

## 2020-12-20 MED ORDER — SODIUM CHLORIDE 0.9 % IV SOLN
200.0000 mg | Freq: Once | INTRAVENOUS | Status: AC
  Administered 2020-12-20: 200 mg via INTRAVENOUS
  Filled 2020-12-20: qty 200

## 2020-12-20 MED ORDER — ACETAMINOPHEN 160 MG/5ML PO SOLN
650.0000 mg | ORAL | Status: DC | PRN
  Filled 2020-12-20: qty 20.3

## 2020-12-20 MED ORDER — LACTATED RINGERS IV SOLN: INTRAVENOUS | Status: DC

## 2020-12-20 MED ORDER — DM-GUAIFENESIN ER 30-600 MG PO TB12: 1.0000 | ORAL_TABLET | Freq: Two times a day (BID) | ORAL | Status: DC | PRN

## 2020-12-20 MED ORDER — HYDRALAZINE HCL 20 MG/ML IJ SOLN
5.0000 mg | INTRAMUSCULAR | Status: DC | PRN
  Administered 2020-12-21 – 2020-12-22 (×4): 5 mg via INTRAVENOUS
  Filled 2020-12-20 (×5): qty 1
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
## 2020-12-20 NOTE — ED Notes (Signed)
Pt's oxygen saturation observed to be 88% on room air, placed patient on 2L nasal cannula oxygen and oxygen sat is 94%. Pt denies shortness of breath. Respirations regular and unlabored.

## 2020-12-20 NOTE — ED Notes (Signed)
Pt to MRI

## 2020-12-20 NOTE — ED Notes (Signed)
Notified Dr Katrinka Blazing and pt's family of COVID positive result.

## 2020-12-20 NOTE — Progress Notes (Signed)
Remdesivir - Pharmacy Brief Note   O:  ALT: 35 CXR: RIGHT basilar atelectasis with questionable hazy infiltrate in RIGHT upper lobe SpO2: 94% on 2 L [place]   A/P:  Remdesivir 200 mg IVPB once followed by 100 mg IVPB daily x 4 days.   Laureen Ochs, PharmD 12/20/2020 1:24 PM

## 2020-12-20 NOTE — Plan of Care (Signed)
  Problem: Education: Goal: Knowledge of disease or condition will improve Outcome: Progressing Goal: Knowledge of secondary prevention will improve Outcome: Progressing Goal: Knowledge of patient specific risk factors addressed and post discharge goals established will improve Outcome: Progressing   Problem: Coping: Goal: Will verbalize positive feelings about self Outcome: Progressing Goal: Will identify appropriate support needs Outcome: Progressing   Problem: Nutrition: Goal: Risk of aspiration will decrease Outcome: Progressing   Problem: Ischemic Stroke/TIA Tissue Perfusion: Goal: Complications of ischemic stroke/TIA will be minimized Outcome: Progressing   Problem: Education: Goal: Knowledge of General Education information will improve Description: Including pain rating scale, medication(s)/side effects and non-pharmacologic comfort measures Outcome: Progressing   Problem: Clinical Measurements: Goal: Ability to maintain clinical measurements within normal limits will improve Outcome: Progressing Goal: Will remain free from infection Outcome: Progressing Goal: Diagnostic test results will improve Outcome: Progressing Goal: Respiratory complications will improve Outcome: Progressing Goal: Cardiovascular complication will be avoided Outcome: Progressing

## 2020-12-20 NOTE — ED Notes (Signed)
Pt resting comfortably in bed, talking on the phone. Stretcher locked in low position with side rails up x2, call light in reach.

## 2020-12-20 NOTE — ED Notes (Signed)
Pt given dinner tray.
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
## 2020-12-21 ENCOUNTER — Inpatient Hospital Stay (HOSPITAL_COMMUNITY)
Admit: 2020-12-21 | Discharge: 2020-12-21 | Disposition: A | Discharge status: Home / Self Care | Payer: Medicare Other | Attending: Internal Medicine | Admitting: Internal Medicine

## 2020-12-21 DIAGNOSIS — I639 Cerebral infarction, unspecified: Secondary | ICD-10-CM

## 2020-12-21 DIAGNOSIS — I6389 Other cerebral infarction: Secondary | ICD-10-CM | POA: Diagnosis not present

## 2020-12-21 LAB — COMPREHENSIVE METABOLIC PANEL
ALT: 32 U/L (ref 0–44)
AST: 45 U/L — ABNORMAL HIGH (ref 15–41)
Albumin: 3.7 g/dL (ref 3.5–5.0)
Alkaline Phosphatase: 41 U/L (ref 38–126)
Anion gap: 9 (ref 5–15)
BUN: 23 mg/dL (ref 8–23)
CO2: 27 mmol/L (ref 22–32)
Calcium: 8.3 mg/dL — ABNORMAL LOW (ref 8.9–10.3)
Chloride: 99 mmol/L (ref 98–111)
Creatinine, Ser: 1.41 mg/dL — ABNORMAL HIGH (ref 0.61–1.24)
GFR, Estimated: 52 mL/min — ABNORMAL LOW (ref >60)
Glucose, Bld: 168 mg/dL — ABNORMAL HIGH (ref 70–99)
Potassium: 4.6 mmol/L (ref 3.5–5.1)
Sodium: 135 mmol/L (ref 135–145)
Total Bilirubin: 0.8 mg/dL (ref 0.3–1.2)
Total Protein: 7.4 g/dL (ref 6.5–8.1)

## 2020-12-21 LAB — ECHOCARDIOGRAM COMPLETE
AR max vel: 2.23 cm2
AV Area VTI: 2.58 cm2
AV Area mean vel: 2.04 cm2
AV Mean grad: 5 mmHg
AV Peak grad: 8.3 mmHg
Ao pk vel: 1.44 m/s
Area-P 1/2: 7.44 cm2
Height: 68 in
MV VTI: 3.46 cm2
S' Lateral: 2.5 cm
Single Plane A4C EF: 32.8 %
Weight: 3104 oz

## 2020-12-21 LAB — CBC WITH DIFFERENTIAL/PLATELET
Abs Immature Granulocytes: 0.02 10*3/uL (ref 0.00–0.07)
Basophils Absolute: 0 10*3/uL (ref 0.0–0.1)
Basophils Relative: 0 %
Eosinophils Absolute: 0 10*3/uL (ref 0.0–0.5)
Eosinophils Relative: 0 %
HCT: 46.2 % (ref 39.0–52.0)
Hemoglobin: 16.4 g/dL (ref 13.0–17.0)
Immature Granulocytes: 1 %
Lymphocytes Relative: 14 %
Lymphs Abs: 0.6 10*3/uL — ABNORMAL LOW (ref 0.7–4.0)
MCH: 26.2 pg (ref 26.0–34.0)
MCHC: 35.5 g/dL (ref 30.0–36.0)
MCV: 73.7 fL — ABNORMAL LOW (ref 80.0–100.0)
Monocytes Absolute: 0.4 10*3/uL (ref 0.1–1.0)
Monocytes Relative: 9 %
Neutro Abs: 3.1 10*3/uL (ref 1.7–7.7)
Neutrophils Relative %: 76 %
Platelets: 170 10*3/uL (ref 150–400)
RBC: 6.27 MIL/uL — ABNORMAL HIGH (ref 4.22–5.81)
RDW: 14.1 % (ref 11.5–15.5)
WBC: 4.1 10*3/uL (ref 4.0–10.5)
nRBC: 0 % (ref 0.0–0.2)

## 2020-12-21 LAB — LIPID PANEL
Cholesterol: 100 mg/dL (ref 0–200)
HDL: 62 mg/dL (ref >40)
LDL Cholesterol: 30 mg/dL (ref 0–99)
Total CHOL/HDL Ratio: 1.6 RATIO
Triglycerides: 42 mg/dL (ref <150)
VLDL: 8 mg/dL (ref 0–40)

## 2020-12-21 LAB — HEMOGLOBIN A1C
Hgb A1c MFr Bld: 6.4 % — ABNORMAL HIGH (ref 4.8–5.6)
Mean Plasma Glucose: 137 mg/dL

## 2020-12-21 LAB — C-REACTIVE PROTEIN: CRP: 14.4 mg/dL — ABNORMAL HIGH (ref <1.0)

## 2020-12-21 LAB — FERRITIN: Ferritin: 243 ng/mL (ref 24–336)

## 2020-12-21 LAB — CK: Total CK: 735 U/L — ABNORMAL HIGH (ref 49–397)

## 2020-12-21 MED ORDER — CLOPIDOGREL BISULFATE 75 MG PO TABS
75.0000 mg | ORAL_TABLET | Freq: Every day | ORAL | Status: DC
  Administered 2020-12-22: 09:00:00 75 mg via ORAL
  Filled 2020-12-21: qty 1

## 2020-12-21 MED ORDER — CLOPIDOGREL BISULFATE 75 MG PO TABS
300.0000 mg | ORAL_TABLET | Freq: Once | ORAL | Status: AC
  Administered 2020-12-21: 14:00:00 300 mg via ORAL
  Filled 2020-12-21: qty 4

## 2020-12-21 MED ORDER — AMLODIPINE BESYLATE 5 MG PO TABS: 5.0000 mg | ORAL_TABLET | Freq: Every day | ORAL | Status: DC

## 2020-12-21 MED ORDER — AMLODIPINE BESYLATE 10 MG PO TABS: 10.0000 mg | ORAL_TABLET | Freq: Every day | ORAL | Status: DC

## 2020-12-21 NOTE — Progress Notes (Addendum)
   12/21/20 0430 12/21/20 0535 12/21/20 0622  Assess: MEWS Score  Temp 97.7 F (36.5 C)  --   --   BP (!) 175/107 (!) 176/105 (will recheck again. Hydrazaline follow up) (!) 182/112  Pulse Rate 77  --  80  Resp 18  --   --   SpO2 96 %  --   --   O2 Device Room Air  --   --   Hydralazine given for HTN. Med not effective messaged on call MD Bount at 0630 due to med was not effective. Awaiting response. Bonnye RN oncoming nurse made aware of awaiting for DR. To respond.
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
## 2020-12-21 NOTE — Progress Notes (Signed)
Gave hydralazine as ordered for bp 189/115, pt sitting up in chair asymptomatic
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
## 2020-12-21 NOTE — Progress Notes (Signed)
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 Limitations: none IMPRESSION: Negative. Electronically Signed   By: Charlett Nose M.D.   On: 12/20/2020 17:14   DG Knee Complete 4 Views Right  Result Date: 12/20/2020 CLINICAL DATA:  Right knee pain after falling a few days previously EXAM: RIGHT KNEE - COMPLETE 4+ VIEW COMPARISON:  None. FINDINGS: No acute fracture, malalignment or joint effusion. No lytic or blastic osseous lesion. Focal soft tissue swelling is present in the prepatellar space. The remaining soft tissues are unremarkable. IMPRESSION: Prepatellar soft tissue swelling may reflect contusion or bursitis. No evidence of fracture, malalignment or knee joint effusion. Electronically Signed   By: Malachy Moan M.D.   On: 12/20/2020 10:53        Scheduled Meds: . amLODipine  5 mg Oral Daily  . aspirin EC  81 mg Oral Daily  . atorvastatin  40 mg Oral Daily  . enoxaparin (LOVENOX) injection  40 mg Subcutaneous Q24H  . ipratropium  2 puff Inhalation Q6H  . methylPREDNISolone (SOLU-MEDROL)  injection  40 mg Intravenous Q12H  . multivitamin with minerals  1 tablet Oral Daily   Continuous Infusions: . lactated ringers 100 mL/hr at 12/21/20 0219  . remdesivir 100 mg in NS 100 mL       LOS: 1 day    Time spent: 35 minutes.     Alba Cory, MD [facility]   If 7PM-7AM, please contact night-coverage [email or web address]  12/21/2020, 7:38 AM

## 2020-12-21 NOTE — Progress Notes (Signed)
SLP Cancellation Note  Patient Details Name: Vincent Simmons MRN: [id number] DOB: 03-26-1946   Cancelled treatment:       Reason Eval/Treat Not Completed: SLP screened, no needs identified, will sign off  Of note, pt passed [employer] Screen x 2 with no overt s/s of aspiration at bedside when consuming current diet.   Nadiya Pieratt B. Dreama Saa M.S., CCC-SLP, [facility] Speech-Language Pathologist Rehabilitation Services Office [phone]   Marleena Shubert Dreama Saa 12/21/2020, 11:18 AM

## 2020-12-21 NOTE — Progress Notes (Signed)
*  PRELIMINARY RESULTS* Echocardiogram 2D Echocardiogram has been performed.  Joanette Gula Vamsi Apfel 12/21/2020, 10:47 AM

## 2020-12-22 LAB — CBC WITH DIFFERENTIAL/PLATELET
Abs Immature Granulocytes: 0.03 10*3/uL (ref 0.00–0.07)
Basophils Absolute: 0 10*3/uL (ref 0.0–0.1)
Basophils Relative: 0 %
Eosinophils Absolute: 0 10*3/uL (ref 0.0–0.5)
Eosinophils Relative: 0 %
HCT: 47.3 % (ref 39.0–52.0)
Hemoglobin: 17 g/dL (ref 13.0–17.0)
Immature Granulocytes: 0 %
Lymphocytes Relative: 8 %
Lymphs Abs: 0.7 10*3/uL (ref 0.7–4.0)
MCH: 26 pg (ref 26.0–34.0)
MCHC: 35.9 g/dL (ref 30.0–36.0)
MCV: 72.4 fL — ABNORMAL LOW (ref 80.0–100.0)
Monocytes Absolute: 0.4 10*3/uL (ref 0.1–1.0)
Monocytes Relative: 5 %
Neutro Abs: 7.2 10*3/uL (ref 1.7–7.7)
Neutrophils Relative %: 87 %
Platelets: 209 10*3/uL (ref 150–400)
RBC: 6.53 MIL/uL — ABNORMAL HIGH (ref 4.22–5.81)
RDW: 14.2 % (ref 11.5–15.5)
WBC: 8.3 10*3/uL (ref 4.0–10.5)
nRBC: 0 % (ref 0.0–0.2)

## 2020-12-22 LAB — URINE DRUG SCREEN, QUALITATIVE (ARMC ONLY)
Amphetamines, Ur Screen: NOT DETECTED
Barbiturates, Ur Screen: NOT DETECTED
Benzodiazepine, Ur Scrn: NOT DETECTED
Cannabinoid 50 Ng, Ur ~~LOC~~: NOT DETECTED
Cocaine Metabolite,Ur ~~LOC~~: NOT DETECTED
MDMA (Ecstasy)Ur Screen: NOT DETECTED
Methadone Scn, Ur: NOT DETECTED
Opiate, Ur Screen: NOT DETECTED
Phencyclidine (PCP) Ur S: NOT DETECTED
Tricyclic, Ur Screen: NOT DETECTED

## 2020-12-22 LAB — COMPREHENSIVE METABOLIC PANEL
ALT: 36 U/L (ref 0–44)
AST: 46 U/L — ABNORMAL HIGH (ref 15–41)
Albumin: 3.8 g/dL (ref 3.5–5.0)
Alkaline Phosphatase: 44 U/L (ref 38–126)
Anion gap: 11 (ref 5–15)
BUN: 21 mg/dL (ref 8–23)
CO2: 25 mmol/L (ref 22–32)
Calcium: 8.4 mg/dL — ABNORMAL LOW (ref 8.9–10.3)
Chloride: 99 mmol/L (ref 98–111)
Creatinine, Ser: 1.26 mg/dL — ABNORMAL HIGH (ref 0.61–1.24)
GFR, Estimated: 60 mL/min — ABNORMAL LOW (ref >60)
Glucose, Bld: 180 mg/dL — ABNORMAL HIGH (ref 70–99)
Potassium: 3.7 mmol/L (ref 3.5–5.1)
Sodium: 135 mmol/L (ref 135–145)
Total Bilirubin: 0.7 mg/dL (ref 0.3–1.2)
Total Protein: 7.2 g/dL (ref 6.5–8.1)

## 2020-12-22 LAB — C-REACTIVE PROTEIN: CRP: 6.5 mg/dL — ABNORMAL HIGH (ref <1.0)

## 2020-12-22 LAB — FERRITIN: Ferritin: 367 ng/mL — ABNORMAL HIGH (ref 24–336)

## 2020-12-22 LAB — CK: Total CK: 420 U/L — ABNORMAL HIGH (ref 49–397)

## 2020-12-22 MED ORDER — ATORVASTATIN CALCIUM 20 MG PO TABS
20.0000 mg | ORAL_TABLET | Freq: Every day | ORAL | Status: DC
  Administered 2020-12-22: 09:00:00 20 mg via ORAL
  Filled 2020-12-22: qty 1

## 2020-12-22 MED ORDER — ALBUTEROL SULFATE HFA 108 (90 BASE) MCG/ACT IN AERS: 2.0000 | INHALATION_SPRAY | RESPIRATORY_TRACT | 0 refills | Status: AC | PRN

## 2020-12-22 MED ORDER — AMLODIPINE BESYLATE 10 MG PO TABS
10.0000 mg | ORAL_TABLET | Freq: Every day | ORAL | Status: DC
  Administered 2020-12-22: 10 mg via ORAL
  Filled 2020-12-22: qty 1

## 2020-12-22 MED ORDER — DM-GUAIFENESIN ER 30-600 MG PO TB12: 1.0000 | ORAL_TABLET | Freq: Two times a day (BID) | ORAL | 0 refills | Status: AC | PRN

## 2020-12-22 MED ORDER — CLOPIDOGREL BISULFATE 75 MG PO TABS: 75.0000 mg | ORAL_TABLET | Freq: Every day | ORAL | 0 refills | Status: AC

## 2020-12-22 MED ORDER — HYDRALAZINE HCL 25 MG PO TABS: 25.0000 mg | ORAL_TABLET | Freq: Three times a day (TID) | ORAL | 0 refills | Status: AC

## 2020-12-22 MED ORDER — HYDRALAZINE HCL 50 MG PO TABS
25.0000 mg | ORAL_TABLET | Freq: Three times a day (TID) | ORAL | Status: DC
  Administered 2020-12-22: 25 mg via ORAL
  Filled 2020-12-22: qty 1

## 2020-12-22 MED ORDER — IPRATROPIUM BROMIDE HFA 17 MCG/ACT IN AERS: 2.0000 | INHALATION_SPRAY | Freq: Four times a day (QID) | RESPIRATORY_TRACT | 12 refills | Status: AC

## 2020-12-22 MED ORDER — ATORVASTATIN CALCIUM 20 MG PO TABS: 20.0000 mg | ORAL_TABLET | Freq: Every day | ORAL | 4 refills | Status: AC

## 2020-12-22 MED ORDER — ASPIRIN 81 MG PO TBEC: 81.0000 mg | DELAYED_RELEASE_TABLET | Freq: Every day | ORAL | 11 refills | Status: AC

## 2020-12-22 MED ORDER — AMLODIPINE BESYLATE 10 MG PO TABS: 10.0000 mg | ORAL_TABLET | Freq: Every day | ORAL | 4 refills | Status: DC

## 2020-12-22 MED ORDER — PREDNISONE 10 MG PO TABS: 30.0000 mg | ORAL_TABLET | Freq: Every day | ORAL | 0 refills | Status: AC

## 2020-12-22 MED ORDER — METFORMIN HCL 500 MG PO TABS: 500.0000 mg | ORAL_TABLET | Freq: Two times a day (BID) | ORAL | 11 refills | Status: AC

## 2020-12-22 NOTE — Progress Notes (Addendum)
Neurology chart review follow-up note Work-up completed and reviewed as below.  Echo Sonographer Comments: Suboptimal apical window and no subcostal window.  IMPRESSIONS  1. Left ventricular ejection fraction, by estimation, is 55 to 60%. The  left ventricle has normal function. The left ventricle has no regional  wall motion abnormalities. There is mild left ventricular hypertrophy.  Left ventricular diastolic parameters  are consistent with Grade I diastolic dysfunction (impaired relaxation).  2. Right ventricular systolic function is normal. The right ventricular  size is normal. Tricuspid regurgitation signal is inadequate for assessing  PA pressure.  3. The mitral valve is normal in structure. No evidence of mitral valve  regurgitation. No evidence of mitral stenosis.  4. The aortic valve is normal in structure. Aortic valve regurgitation is  mild. Mild aortic valve sclerosis is present, with no evidence of aortic  valve stenosis.  FINDINGS  Left Ventricle: Left ventricular ejection fraction, by estimation, is 55  to 60%. The left ventricle has normal function. The left ventricle has no  regional wall motion abnormalities. The left ventricular internal cavity  size was normal in size. There is  mild left ventricular hypertrophy. Left ventricular diastolic parameters  are consistent with Grade I diastolic dysfunction (impaired relaxation).  Right Ventricle: The right ventricular size is normal. No increase in  right ventricular wall thickness. Right ventricular systolic function is  normal. Tricuspid regurgitation signal is inadequate for assessing PA  pressure.  Left Atrium: Left atrial size was normal in size.  Right Atrium: Right atrial size was normal in size.  Pericardium: There is no evidence of pericardial effusion.  Mitral Valve: The mitral valve is normal in structure. No evidence of  mitral valve regurgitation. No evidence of mitral valve stenosis. MV peak   gradient, 5.3 mmHg. The mean mitral valve gradient is 2.0 mmHg.  Tricuspid Valve: The tricuspid valve is normal in structure. Tricuspid  valve regurgitation is not demonstrated. No evidence of tricuspid  stenosis.  Aortic Valve: The aortic valve is normal in structure. Aortic valve  regurgitation is mild. Mild aortic valve sclerosis is present, with no  evidence of aortic valve stenosis. Aortic valve mean gradient measures 5.0  mmHg. Aortic valve peak gradient  measures 8.3 mmHg. Aortic valve area, by VTI measures 2.58 cm.  Pulmonic Valve: The pulmonic valve was normal in structure. Pulmonic valve  regurgitation is trivial. No evidence of pulmonic stenosis.  Aorta: The aortic root is normal in size and structure.  Venous: The inferior vena cava was not well visualized.  IAS/Shunts: No atrial level shunt detected by color flow Doppler.    Lower extremity Dopplers: negative for DVT Chest imaging CT angio chest PE protocol: negative for PE. A1c: 6.4 LDL: 30 MR Brain w/o, MRA head w/o and MRA Neck w/o IMPRESSION: 1. 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 2. 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 3. Negative head MRA. 4. Wide patency of the included portions of the carotid and vertebral arteries in the neck.  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  Atorvastatin 20 daily  PT OT speech therapy  Frequent neurochecks  HTN: goal outpatient systolic blood pressure less than 140.  Management of AKI, hypokalemia, adenopathy, rhabdomyolysis and COVID-19 pneumonia per primary team  Outpatient neurology follow-up in 4 to 6 weeks-patient lives  in [place], can follow-up with [facility].  If he wishes to follow-up in  [place], a referral can be placed to the stroke clinic at [facility] neurology.  Plan relayed to Dr. Sunnie Nielsen via secure chat  -- Milon Dikes, MD Neurologist [facility] Pager: [phone]

## 2020-12-22 NOTE — Discharge Summary (Addendum)
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 left ventricle has normal function. The left ventricle has no regional wall motion abnormalities. The left ventricular internal cavity size was normal in size. There is  mild left ventricular hypertrophy. Left ventricular diastolic parameters are consistent with Grade I diastolic dysfunction (impaired relaxation). Right Ventricle: The right ventricular size is normal. No increase in right ventricular wall thickness. Right ventricular systolic function is normal. Tricuspid regurgitation signal is inadequate for assessing PA pressure. Left Atrium: Left atrial size was normal in size. Right Atrium: Right atrial size was normal in size. Pericardium: There is no evidence of pericardial effusion. Mitral Valve: The mitral valve is normal in structure. No evidence of mitral valve regurgitation. No evidence of mitral valve stenosis. MV peak gradient, 5.3 mmHg. The mean mitral valve gradient is 2.0 mmHg. Tricuspid Valve: The tricuspid valve is normal in structure. Tricuspid valve regurgitation is not demonstrated. No evidence of tricuspid stenosis. Aortic Valve: The aortic valve is normal in structure. Aortic valve regurgitation is mild. Mild  aortic valve sclerosis is present, with no evidence of aortic valve stenosis. Aortic valve mean gradient measures 5.0 mmHg. Aortic valve peak gradient measures 8.3 mmHg. Aortic valve area, by VTI measures 2.58 cm. Pulmonic Valve: The pulmonic valve was normal in structure. Pulmonic valve regurgitation is trivial. No evidence of pulmonic stenosis. Aorta: The aortic root is normal in size and structure. Venous: The inferior vena cava was not well visualized. IAS/Shunts: No atrial level shunt detected by color flow Doppler.  LEFT VENTRICLE PLAX 2D LVIDd:         3.20 cm      Diastology LVIDs:  2.50 cm      LV e' medial:    4.57 cm/s LV PW:         1.10 cm      LV E/e' medial:  14.4 LV IVS:        0.90 cm      LV e' lateral:   7.40 cm/s LVOT diam:     2.00 cm      LV E/e' lateral: 8.9 LV SV:         65 LV SV Index:   32 LVOT Area:     3.14 cm  LV Volumes (MOD) LV vol d, MOD A4C: 110.0 ml LV vol s, MOD A4C: 73.9 ml LV SV MOD A4C:     110.0 ml RIGHT VENTRICLE RV Basal diam:  2.60 cm LEFT ATRIUM           Index       RIGHT ATRIUM          Index LA diam:      3.20 cm 1.59 cm/m  RA Area:     9.97 cm LA Vol (A2C): 49.4 ml 24.49 ml/m RA Volume:   20.40 ml 10.11 ml/m  AORTIC VALVE                    PULMONIC VALVE AV Area (Vmax):    2.23 cm     PV Vmax:       1.28 m/s AV Area (Vmean):   2.04 cm     PV Vmean:      86.300 cm/s AV Area (VTI):     2.58 cm     PV VTI:        0.227 m AV Vmax:           144.00 cm/s  PV Peak grad:  6.6 mmHg AV Vmean:          105.000 cm/s PV Mean grad:  3.0 mmHg AV VTI:            0.252 m AV Peak Grad:      8.3 mmHg AV Mean Grad:      5.0 mmHg LVOT Vmax:         102.00 cm/s LVOT Vmean:        68.100 cm/s LVOT VTI:          0.207 m LVOT/AV VTI ratio: 0.82  AORTA Ao Root diam: 3.30 cm MITRAL VALVE MV Area (PHT): 7.44 cm     SHUNTS MV Area VTI:   3.46 cm     Systemic VTI:  0.21 m MV Peak grad:  5.3 mmHg     Systemic Diam: 2.00 cm MV Mean grad:  2.0 mmHg MV Vmax:       1.15 m/s MV Vmean:       67.5 cm/s MV Decel Time: 102 msec MV E velocity: 65.60 cm/s MV A velocity: 105.00 cm/s MV E/A ratio:  0.62 Lorine BearsMuhammad Arida MD Electronically signed by Lorine BearsMuhammad Arida MD Signature Date/Time: 12/21/2020/12:20:07 PM    Final     Subjective: His speech is clear. He is breathing well, mild cough. Feels ready to go home.   Discharge Exam: Vitals:   12/22/20 0638 12/22/20 1142  BP: (!) 183/109 (!) 153/97  Pulse: 95 99  Resp: 16 (!) 25  Temp: 97.9 F (36.6 C) 98.4 F (36.9 C)  SpO2: 99% 98%     General: Pt is alert, awake, not in acute distress Cardiovascular: RRR, S1/S2 +, no rubs,  no gallops Respiratory: CTA bilaterally, no wheezing, no rhonchi Abdominal: Soft, NT, ND, bowel sounds + Extremities: no edema, no cyanosis    The results of significant diagnostics from this hospitalization (including imaging, microbiology, ancillary and laboratory) are listed below for reference.     Microbiology: Recent Results (from the past 240 hour(s))  Resp Panel by RT-PCR (Flu A&B, Covid) Nasopharyngeal Swab     Status: Abnormal   Collection Time: 12/20/20 11:12 AM   Specimen: Nasopharyngeal Swab; Nasopharyngeal(NP) swabs in vial transport medium  Result Value Ref Range Status   SARS Coronavirus 2 by RT PCR POSITIVE (A) NEGATIVE Final    Comment: Results Called to: JENNIFER GREGORY 1214 12/20/2020 DLB (NOTE) SARS-CoV-2 target nucleic acids are DETECTED.  The SARS-CoV-2 RNA is generally detectable in upper respiratory specimens during the acute phase of infection. Positive results are indicative of the presence of the identified virus, but do not rule out bacterial infection or co-infection with other pathogens not detected by the test. Clinical correlation with patient history and other diagnostic information is necessary to determine patient infection status. The expected result is Negative.  Fact Sheet for Patients: BloggerCourse.com  Fact Sheet for Healthcare  Providers: SeriousBroker.it  This test is not yet approved or cleared by the [place] FDA and  has been authorized for detection and/or diagnosis of SARS-CoV-2 by FDA under an Emergency Use Authorization (EUA).  This EUA will remain in effect (meaning this test can be used) for the duration of  t he COVID-19 declaration under Section 564(b)(1) of the Act, 21 U.S.C. section 360bbb-3(b)(1), unless the authorization is terminated or revoked sooner.     Influenza A by PCR NEGATIVE NEGATIVE Final   Influenza B by PCR NEGATIVE NEGATIVE Final    Comment: (NOTE) The Xpert Xpress SARS-CoV-2/FLU/RSV plus assay is intended as an aid in the diagnosis of influenza from Nasopharyngeal swab specimens and should not be used as a sole basis for treatment. Nasal washings and aspirates are unacceptable for Xpert Xpress SARS-CoV-2/FLU/RSV testing.  Fact Sheet for Patients: BloggerCourse.com  Fact Sheet for Healthcare Providers: SeriousBroker.it  This test is not yet approved or cleared by the [place] FDA and has been authorized for detection and/or diagnosis of SARS-CoV-2 by FDA under an Emergency Use Authorization (EUA). This EUA will remain in effect (meaning this test can be used) for the duration of the COVID-19 declaration under Section 564(b)(1) of the Act, 21 U.S.C. section 360bbb-3(b)(1), unless the authorization is terminated or revoked.  Performed at [facility], [address] [place], [place] [address]      Labs: BNP (last 3 results) Recent Labs    12/20/20 0929  BNP 24.9   Basic Metabolic Panel: Recent Labs  Lab 12/20/20 0929 12/21/20 0326 12/22/20 0559  NA 136 135 135  K 3.2* 4.6 3.7  CL 101 99 99  CO2 GLUCOSE 111* 168* 180*  BUN 25* 23 21  CREATININE 1.81* 1.41* 1.26*  CALCIUM 9.0 8.3* 8.4*  MG 2.3  --   --    Liver Function Tests: Recent Labs  Lab  12/20/20 0929 12/21/20 0326 12/22/20 0559  AST 51* 45* 46*  ALT 35 32 36  ALKPHOS 48 41 44  BILITOT 0.9 0.8 0.7  PROT 7.8 7.4 7.2  ALBUMIN 4.5 3.7 3.8   No results for input(s): LIPASE, AMYLASE in the last 168 hours. No results for input(s): AMMONIA in the last 168 hours. CBC: Recent Labs  Lab 12/20/20 [phone]  12/21/20 0326 12/22/20 0559  WBC 7.5 4.1 8.3  NEUTROABS  --  3.1 7.2  HGB 16.7 16.4 17.0  HCT 47.9 46.2 47.3  MCV 74.4* 73.7* 72.4*  PLT 185 170 209   Cardiac Enzymes: Recent Labs  Lab 12/20/20 0929 12/21/20 0326 12/22/20 0815  CKTOTAL 819* 735* 420*   BNP: Invalid input(s): POCBNP CBG: No results for input(s): GLUCAP in the last 168 hours. D-Dimer Recent Labs    12/20/20 1117  DDIMER 2.78*   Hgb A1c Recent Labs    12/21/20 0326  HGBA1C 6.4*   Lipid Profile Recent Labs    12/21/20 0326  CHOL 100  HDL 62  LDLCALC 30  TRIG 42  CHOLHDL 1.6   Thyroid function studies Recent Labs    12/20/20 0929  TSH 1.449   Anemia work up Recent Labs    12/21/20 0326 12/22/20 0559  FERRITIN 243 367*   Urinalysis    Component Value Date/Time   COLORURINE AMBER (A) 12/20/2020 1112   APPEARANCEUR HAZY (A) 12/20/2020 1112   LABSPEC 1.025 12/20/2020 1112   PHURINE 5.0 12/20/2020 1112   GLUCOSEU NEGATIVE 12/20/2020 1112   HGBUR NEGATIVE 12/20/2020 1112   BILIRUBINUR NEGATIVE 12/20/2020 1112   KETONESUR NEGATIVE 12/20/2020 1112   PROTEINUR 100 (A) 12/20/2020 1112   NITRITE NEGATIVE 12/20/2020 1112   LEUKOCYTESUR NEGATIVE 12/20/2020 1112   Sepsis Labs Invalid input(s): PROCALCITONIN,  WBC,  LACTICIDVEN Microbiology Recent Results (from the past 240 hour(s))  Resp Panel by RT-PCR (Flu A&B, Covid) Nasopharyngeal Swab     Status: Abnormal   Collection Time: 12/20/20 11:12 AM   Specimen: Nasopharyngeal Swab; Nasopharyngeal(NP) swabs in vial transport medium  Result Value Ref Range Status   SARS Coronavirus 2 by RT PCR POSITIVE (A) NEGATIVE Final     Comment: Results Called to: JENNIFER GREGORY 1214 12/20/2020 DLB (NOTE) SARS-CoV-2 target nucleic acids are DETECTED.  The SARS-CoV-2 RNA is generally detectable in upper respiratory specimens during the acute phase of infection. Positive results are indicative of the presence of the identified virus, but do not rule out bacterial infection or co-infection with other pathogens not detected by the test. Clinical correlation with patient history and other diagnostic information is necessary to determine patient infection status. The expected result is Negative.  Fact Sheet for Patients: BloggerCourse.com  Fact Sheet for Healthcare Providers: SeriousBroker.it  This test is not yet approved or cleared by the [place] FDA and  has been authorized for detection and/or diagnosis of SARS-CoV-2 by FDA under an Emergency Use Authorization (EUA).  This EUA will remain in effect (meaning this test can be used) for the duration of  t he COVID-19 declaration under Section 564(b)(1) of the Act, 21 U.S.C. section 360bbb-3(b)(1), unless the authorization is terminated or revoked sooner.     Influenza A by PCR NEGATIVE NEGATIVE Final   Influenza B by PCR NEGATIVE NEGATIVE Final    Comment: (NOTE) The Xpert Xpress SARS-CoV-2/FLU/RSV plus assay is intended as an aid in the diagnosis of influenza from Nasopharyngeal swab specimens and should not be used as a sole basis for treatment. Nasal washings and aspirates are unacceptable for Xpert Xpress SARS-CoV-2/FLU/RSV testing.  Fact Sheet for Patients: BloggerCourse.com  Fact Sheet for Healthcare Providers: SeriousBroker.it  This test is not yet approved or cleared by the [place] FDA and has been authorized for detection and/or diagnosis of SARS-CoV-2 by FDA under an Emergency Use Authorization (EUA). This EUA will remain in effect (meaning  this test can  be used) for the duration of the COVID-19 declaration under Section 564(b)(1) of the Act, 21 U.S.C. section 360bbb-3(b)(1), unless the authorization is terminated or revoked.  Performed at [facility], [address], [place], [place] [address]      Time coordinating discharge: 40 minutes  SIGNED:   Alba Cory, MD  [facility]
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
## 2020-12-22 NOTE — Discharge Instructions (Signed)
You need to Quarantine for 10 days from 6-08/2020

## 2021-01-15 ENCOUNTER — Ambulatory Visit: Payer: Medicare Other | Attending: Internal Medicine

## 2021-01-15 DIAGNOSIS — M25561 Pain in right knee: Secondary | ICD-10-CM | POA: Diagnosis present

## 2021-01-15 DIAGNOSIS — R262 Difficulty in walking, not elsewhere classified: Secondary | ICD-10-CM | POA: Diagnosis present
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
## 2021-01-17 ENCOUNTER — Ambulatory Visit: Payer: Medicare Other

## 2021-01-17 DIAGNOSIS — R262 Difficulty in walking, not elsewhere classified: Secondary | ICD-10-CM

## 2021-01-17 DIAGNOSIS — M25561 Pain in right knee: Secondary | ICD-10-CM
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
## 2021-01-17 NOTE — Patient Instructions (Signed)
Access Code: [email or web address] URL: [email or web address] Date: 01/17/2021 Prepared by: Loralyn Freshwater  Exercises Seated Hip External Rotation AROM - 1 x daily - 7 x weekly - 3 sets - 10 reps - 5 seconds hold

## 2021-01-23 ENCOUNTER — Other Ambulatory Visit: Payer: Self-pay

## 2021-01-23 ENCOUNTER — Ambulatory Visit: Payer: Medicare Other | Attending: Internal Medicine

## 2021-01-23 DIAGNOSIS — R262 Difficulty in walking, not elsewhere classified: Secondary | ICD-10-CM | POA: Diagnosis present

## 2021-01-23 DIAGNOSIS — M25561 Pain in right knee: Secondary | ICD-10-CM | POA: Insufficient documentation
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
## 2021-01-28 ENCOUNTER — Ambulatory Visit: Payer: Medicare Other

## 2021-01-30 ENCOUNTER — Ambulatory Visit: Payer: Medicare Other

## 2021-01-30 DIAGNOSIS — M25561 Pain in right knee: Secondary | ICD-10-CM | POA: Diagnosis not present

## 2021-01-30 DIAGNOSIS — R262 Difficulty in walking, not elsewhere classified: Secondary | ICD-10-CM
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
## 2021-02-04 ENCOUNTER — Ambulatory Visit: Payer: Medicare Other

## 2021-02-04 DIAGNOSIS — R262 Difficulty in walking, not elsewhere classified: Secondary | ICD-10-CM

## 2021-02-04 DIAGNOSIS — M25561 Pain in right knee: Secondary | ICD-10-CM
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
## 2021-02-04 NOTE — Patient Instructions (Signed)
Access Code: [email or web address] URL: [email or web address] Date: 02/04/2021 Prepared by: Loralyn Freshwater  Exercises Seated Hip External Rotation AROM - 1 x daily - 7 x weekly - 3 sets - 10 reps - 5 seconds hold Seated Hip Abduction with Resistance - 1 x daily - 7 x weekly - 3 sets - 10 reps Standing Hip Abduction with Counter Support - 3 x daily - 7 x weekly - 1 sets - 10 reps - 5 seconds hold

## 2021-02-06 ENCOUNTER — Ambulatory Visit: Payer: Medicare Other

## 2021-02-06 VITALS — BP 155/76 | HR 96

## 2021-02-06 DIAGNOSIS — R262 Difficulty in walking, not elsewhere classified: Secondary | ICD-10-CM

## 2021-02-06 DIAGNOSIS — M25561 Pain in right knee: Secondary | ICD-10-CM
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
## 2021-02-11 ENCOUNTER — Ambulatory Visit: Payer: Medicare Other

## 2021-02-11 VITALS — BP 154/82

## 2021-02-11 DIAGNOSIS — M25561 Pain in right knee: Secondary | ICD-10-CM | POA: Diagnosis not present

## 2021-02-11 DIAGNOSIS — R262 Difficulty in walking, not elsewhere classified: Secondary | ICD-10-CM
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
## 2021-02-11 NOTE — Patient Instructions (Signed)
Access Code: [email or web address] URL: [email or web address] Date: 02/11/2021 Prepared by: Loralyn Freshwater  Exercises Seated Hip External Rotation AROM - 1 x daily - 7 x weekly - 3 sets - 10 reps - 5 seconds hold Seated Hip Abduction with Resistance - 1 x daily - 7 x weekly - 3 sets - 10 reps Standing Hip Abduction with Counter Support - 3 x daily - 7 x weekly - 1 sets - 10 reps - 5 seconds hold Seated Hip Adduction Isometrics with Ball - 1 x daily - 7 x weekly - 3 sets - 10 reps - 5 seconds hold

## 2021-02-13 ENCOUNTER — Ambulatory Visit: Payer: Medicare Other

## 2021-02-13 VITALS — BP 158/81

## 2021-02-13 DIAGNOSIS — M25561 Pain in right knee: Secondary | ICD-10-CM

## 2021-02-13 DIAGNOSIS — R262 Difficulty in walking, not elsewhere classified: Secondary | ICD-10-CM
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
## 2021-02-19 ENCOUNTER — Ambulatory Visit: Payer: Medicare Other

## 2021-02-21 ENCOUNTER — Ambulatory Visit: Payer: Medicare Other

## 2021-08-22 DIAGNOSIS — R7303 Prediabetes: Secondary | ICD-10-CM | POA: Diagnosis not present

## 2021-08-22 DIAGNOSIS — R5383 Other fatigue: Secondary | ICD-10-CM | POA: Diagnosis not present

## 2021-08-22 DIAGNOSIS — I10 Essential (primary) hypertension: Secondary | ICD-10-CM | POA: Diagnosis not present

## 2021-08-22 DIAGNOSIS — E781 Pure hyperglyceridemia: Secondary | ICD-10-CM | POA: Diagnosis not present

## 2021-08-26 DIAGNOSIS — E782 Mixed hyperlipidemia: Secondary | ICD-10-CM | POA: Diagnosis not present

## 2021-08-26 DIAGNOSIS — R339 Retention of urine, unspecified: Secondary | ICD-10-CM | POA: Diagnosis not present

## 2021-08-26 DIAGNOSIS — R7303 Prediabetes: Secondary | ICD-10-CM | POA: Diagnosis not present

## 2021-08-26 DIAGNOSIS — I10 Essential (primary) hypertension: Secondary | ICD-10-CM | POA: Diagnosis not present

## 2022-01-06 DIAGNOSIS — I10 Essential (primary) hypertension: Secondary | ICD-10-CM | POA: Diagnosis not present

## 2022-01-06 DIAGNOSIS — R7303 Prediabetes: Secondary | ICD-10-CM | POA: Diagnosis not present

## 2022-01-06 DIAGNOSIS — R339 Retention of urine, unspecified: Secondary | ICD-10-CM | POA: Diagnosis not present

## 2022-01-06 DIAGNOSIS — N189 Chronic kidney disease, unspecified: Secondary | ICD-10-CM | POA: Diagnosis not present

## 2022-01-06 DIAGNOSIS — E782 Mixed hyperlipidemia: Secondary | ICD-10-CM | POA: Diagnosis not present

## 2022-01-09 DIAGNOSIS — R7303 Prediabetes: Secondary | ICD-10-CM | POA: Diagnosis not present

## 2022-01-09 DIAGNOSIS — M1990 Unspecified osteoarthritis, unspecified site: Secondary | ICD-10-CM | POA: Diagnosis not present

## 2022-01-09 DIAGNOSIS — I10 Essential (primary) hypertension: Secondary | ICD-10-CM | POA: Diagnosis not present

## 2023-02-10 ENCOUNTER — Other Ambulatory Visit: Payer: Self-pay | Admitting: Nurse Practitioner

## 2023-05-15 ENCOUNTER — Other Ambulatory Visit: Payer: Self-pay | Admitting: Family

## 2023-05-25 ENCOUNTER — Other Ambulatory Visit: Payer: Self-pay

## 2023-05-25 ENCOUNTER — Other Ambulatory Visit: Payer: Self-pay | Admitting: Cardiology

## 2023-05-25 MED ORDER — AMLODIPINE BESYLATE 10 MG PO TABS: 10.0000 mg | ORAL_TABLET | Freq: Every day | ORAL | 0 refills | Status: AC

## 2023-06-23 ENCOUNTER — Other Ambulatory Visit: Payer: Self-pay | Admitting: Cardiology

## 2023-09-15 ENCOUNTER — Other Ambulatory Visit: Payer: Self-pay | Admitting: Family
